# Patient Record
Sex: Female | Born: 1955
Health system: Southern US, Community
[De-identification: ages and names within clinical notes are randomized; demographics above are authoritative.]

## PROBLEM LIST (undated history)

## (undated) DIAGNOSIS — E039 Hypothyroidism, unspecified: Secondary | ICD-10-CM

## (undated) DIAGNOSIS — I10 Essential (primary) hypertension: Secondary | ICD-10-CM

## (undated) HISTORY — DX: Essential (primary) hypertension: I10

## (undated) HISTORY — PX: ABDOMINAL HYSTERECTOMY: SHX81

## (undated) HISTORY — DX: Hypothyroidism, unspecified: E03.9

---

## 1997-05-21 ENCOUNTER — Inpatient Hospital Stay (HOSPITAL_COMMUNITY): Admission: AD | Admit: 1997-05-21 | Discharge: 1997-05-21 | Payer: Self-pay | Admitting: Obstetrics & Gynecology

## 1997-05-28 ENCOUNTER — Inpatient Hospital Stay (HOSPITAL_COMMUNITY): Admission: AD | Admit: 1997-05-28 | Discharge: 1997-05-31 | Payer: Self-pay | Admitting: Obstetrics and Gynecology

## 1997-06-13 ENCOUNTER — Encounter: Admission: RE | Admit: 1997-06-13 | Discharge: 1997-09-11 | Payer: Self-pay | Admitting: Obstetrics and Gynecology

## 1997-10-26 ENCOUNTER — Other Ambulatory Visit: Admission: RE | Admit: 1997-10-26 | Discharge: 1997-10-26 | Payer: Self-pay | Admitting: Obstetrics and Gynecology

## 1998-01-02 ENCOUNTER — Ambulatory Visit (HOSPITAL_COMMUNITY): Admission: RE | Admit: 1998-01-02 | Discharge: 1998-01-02 | Payer: Self-pay | Admitting: Internal Medicine

## 1998-01-02 ENCOUNTER — Encounter: Payer: Self-pay | Admitting: Internal Medicine

## 1999-11-10 ENCOUNTER — Other Ambulatory Visit: Admission: RE | Admit: 1999-11-10 | Discharge: 1999-11-10 | Payer: Self-pay | Admitting: Obstetrics and Gynecology

## 1999-11-11 ENCOUNTER — Other Ambulatory Visit: Admission: RE | Admit: 1999-11-11 | Discharge: 1999-11-11 | Payer: Self-pay | Admitting: Obstetrics and Gynecology

## 1999-11-11 ENCOUNTER — Encounter (INDEPENDENT_AMBULATORY_CARE_PROVIDER_SITE_OTHER): Payer: Self-pay | Admitting: Specialist

## 1999-11-11 ENCOUNTER — Encounter (INDEPENDENT_AMBULATORY_CARE_PROVIDER_SITE_OTHER): Payer: Self-pay | Admitting: *Deleted

## 1999-12-01 ENCOUNTER — Ambulatory Visit (HOSPITAL_COMMUNITY): Admission: RE | Admit: 1999-12-01 | Discharge: 1999-12-01 | Payer: Self-pay | Admitting: Obstetrics and Gynecology

## 1999-12-01 ENCOUNTER — Encounter: Payer: Self-pay | Admitting: Obstetrics and Gynecology

## 2000-11-28 ENCOUNTER — Emergency Department (HOSPITAL_COMMUNITY): Admission: EM | Admit: 2000-11-28 | Discharge: 2000-11-28 | Payer: Self-pay | Admitting: Emergency Medicine

## 2001-08-30 ENCOUNTER — Encounter (INDEPENDENT_AMBULATORY_CARE_PROVIDER_SITE_OTHER): Payer: Self-pay | Admitting: Specialist

## 2001-08-30 ENCOUNTER — Inpatient Hospital Stay (HOSPITAL_COMMUNITY): Admission: RE | Admit: 2001-08-30 | Discharge: 2001-09-01 | Payer: Self-pay | Admitting: Obstetrics and Gynecology

## 2002-10-17 ENCOUNTER — Ambulatory Visit (HOSPITAL_COMMUNITY): Admission: RE | Admit: 2002-10-17 | Discharge: 2002-10-17 | Payer: Self-pay | Admitting: Obstetrics and Gynecology

## 2002-10-17 ENCOUNTER — Encounter: Payer: Self-pay | Admitting: Obstetrics and Gynecology

## 2002-10-20 ENCOUNTER — Emergency Department (HOSPITAL_COMMUNITY): Admission: EM | Admit: 2002-10-20 | Discharge: 2002-10-20 | Payer: Self-pay | Admitting: Emergency Medicine

## 2002-10-20 ENCOUNTER — Encounter: Payer: Self-pay | Admitting: Emergency Medicine

## 2003-04-29 ENCOUNTER — Emergency Department (HOSPITAL_COMMUNITY): Admission: EM | Admit: 2003-04-29 | Discharge: 2003-04-29 | Payer: Self-pay | Admitting: Emergency Medicine

## 2005-05-01 ENCOUNTER — Ambulatory Visit (HOSPITAL_COMMUNITY): Admission: RE | Admit: 2005-05-01 | Discharge: 2005-05-01 | Payer: Self-pay | Admitting: Family Medicine

## 2008-04-15 ENCOUNTER — Emergency Department (HOSPITAL_COMMUNITY): Admission: EM | Admit: 2008-04-15 | Discharge: 2008-04-15 | Payer: Self-pay | Admitting: Family Medicine

## 2008-04-16 ENCOUNTER — Emergency Department (HOSPITAL_COMMUNITY): Admission: EM | Admit: 2008-04-16 | Discharge: 2008-04-16 | Payer: Self-pay | Admitting: Emergency Medicine

## 2008-06-04 ENCOUNTER — Ambulatory Visit (HOSPITAL_COMMUNITY): Admission: RE | Admit: 2008-06-04 | Discharge: 2008-06-04 | Payer: Self-pay | Admitting: Neurology

## 2010-02-16 ENCOUNTER — Encounter: Payer: Self-pay | Admitting: Family Medicine

## 2010-03-10 ENCOUNTER — Other Ambulatory Visit: Payer: Self-pay | Admitting: Dermatology
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
## 2010-05-06 LAB — OLIGOCLONAL BANDS, CSF + SERM
CSF Oligoclonal Bands: POSITIVE
IgG Index, CSF: 0.77 ratio — ABNORMAL HIGH (ref 0.28–0.66)
IgG/Albumin Ratio, CSF: 0.25 ratio (ref 0.09–0.25)
MS CNS IgG Synthesis Rate: 5.5 mg/d (ref <=8.0)

## 2010-05-06 LAB — PROTEIN AND GLUCOSE, CSF
Glucose, CSF: 60 mg/dL (ref 43–76)
Total  Protein, CSF: 37 mg/dL (ref 15–45)

## 2010-05-06 LAB — CSF CELL COUNT WITH DIFFERENTIAL: Tube #: 2

## 2010-05-06 LAB — VDRL, CSF: VDRL Quant, CSF: NONREACTIVE

## 2010-05-06 LAB — B. BURGDORFI ANTIBODIES, CSF: Lyme Ab: 0.18 LIV

## 2010-05-08 LAB — URINALYSIS, ROUTINE W REFLEX MICROSCOPIC
Bilirubin Urine: NEGATIVE
Ketones, ur: NEGATIVE mg/dL
Nitrite: NEGATIVE
Protein, ur: NEGATIVE mg/dL
Urobilinogen, UA: 1 mg/dL (ref 0.0–1.0)

## 2010-05-08 LAB — URINE MICROSCOPIC-ADD ON

## 2010-06-13 NOTE — Discharge Summary (Signed)
NAME:  Jillian Wright, Jillian Wright                     ACCOUNT NO.:  [id number]   MEDICAL RECORD NO.:  [id number]                   PATIENT TYPE:  INP   LOCATION:  9326                                 FACILITY:  [facility]   PHYSICIAN:  Hal Morales, M.D.             DATE OF BIRTH:  [birth year]   DATE OF ADMISSION:  08/30/2001  DATE OF DISCHARGE:  09/01/2001                                 DISCHARGE SUMMARY   DISCHARGE DIAGNOSES:  1. Menorrhagia.  2. Fibroid uterus.  3. Pelvic adhesions.   OPERATION:  On the day of admission the patient underwent a total abdominal  hysterectomy with a bilateral salpingectomy with lysis of adhesions,  tolerating all procedures well.   HISTORY OF PRESENT ILLNESS:  The patient is a 55 year old married African-  American female, para 3-0-0-3 who is status post bilateral tubal ligation  with a long-standing history of uterine fibroids and menorrhagia. The  patient represents for definitive treatment of her symptoms through  hysterectomy.  Please see patient's dictated history and physical  examination for details.   PHYSICAL EXAMINATION:  VITAL SIGNS:  Blood pressure 100/60. Weight is 190.  Height is 5 feet 7-1/2 inches tall.  GENERAL EXAM:  Within normal limits, except note on the abdominal exam bowel  sounds are present.  It is soft nontender and there is a mass from the  pelvis to the level of three fingerbreadths below the umbilicus.  PELVIC:  External genitalia/Bartholin, urethral, and Skene glands within  normal limits. The vagina is ruga.  Cervix is nontender without lesions.  Uterus is 18 to 16 weeks size and irregular without tenderness.  Adnexa  without tenderness or masses.  Rectovaginal exam confirms.   HOSPITAL COURSE:  On the day of admission the patient underwent  aforementioned procedures tolerating them all well.  Postoperative course  was unremarkable with the exception of the patient experiencing some  agitation following anesthesia.   Postoperative hemoglobin 9.1 (preoperative  hemoglobin 11.1).  By postoperative day #2 the patient had resumed bowel and  bladder function and was admitted for discharge home.   DISCHARGE MEDICATIONS:  1. Darvocet-N 100 one tablet every four hours as needed for pain.  2. Ibuprofen 600 mg one tablet every six hours with food for five days as     needed for pain.  3. Phenergan one tablet every six hours as needed for nausea.  4. Stool softeners 100 mg one tablet twice daily until bowel movements are     regular.  5. Iron 325 mg one tablet twice daily for six weeks.   FOLLOW UP:  The patient is scheduled for a six weeks' postoperative exam  with Dr. Pennie Rushing on October 10, 2001 at 9:45 a.m.   DISCHARGE INSTRUCTIONS:  The patient was given a copy of [facility] postoperative instruction sheet.  She was further  advised of no driving for two weeks, heavy lifting for four weeks  and  intercourse for six weeks.  The patient's diet was without restrictions.   FINAL PATHOLOGY:  UTERUS, CERVIX, BILATERAL FALLOPIAN TUBES: Benign cervix.  No dysplasia identified.  ENDOMETRIUM:  Endometrial polyps with simple hyperplasia without atypia.  Proliferative endometrium.  No hyperplasia in nonpolypoid endometrium.  Leiomyomata with degenerative changes intramural.  Uterine serosal fibrous  adhesions.  No endometriosis or malignancy identified.  Benign right and  left fallopian tubes.     Elmira J. Adline Peals.                    Hal Morales, M.D.    EJP/MEDQ  D:  09/29/2001  T:  09/29/2001  Job:  [id number]

## 2010-06-13 NOTE — H&P (Signed)
[facility]  Patient:    Jillian Wright, Jillian Wright Visit Number: [id number] MRN: [id number]          Service Type: Attending:  Maris Berger. Pennie Rushing, M.D. Dictated by:   Henreitta Leber, P.A. Adm. Date:  08/08/01                           History and Physical  DATE OF BIRTH:                [birth year]  HISTORY OF PRESENT ILLNESS:  The patient is a 55 year old married African-American female, para 3-0-0-3 who is status post bilateral tubal ligation with a long standing history of uterine fibroids and menorrhagia. The patients most recent menstrual period lasted 24 days causing her to have to change an overnight pad six times daily and to wear at night "pull-ups". The patient also states she had moderate cramping along with rectal pain; however, they both responded well to Motrin 400 mg as prescribed. An endometrial biopsy of July 26, 2001 was insufficient for diagnosis but a previous study in October of 2001 for the same symptoms revealed benign proliferative endometrium. The patient had a TSH in July of 2003 which was within normal limits and her most recent hemoglobin (July 26, 2001) was 11.6. After reviewing in detail the medical and surgical options available for management of menorrhagia, the patient has decided in favor of hysterectomy. The [employer] [facility] brochure on hysterectomy and preparing for surgery were also given to the patient.  PAST MEDICAL HISTORY:  OBSTETRIC HISTORY:             Gravida 3, para 3-0-0-3; the patient experienced cesarean section x3.  GYNECOLOGIC HISTORY:          Menarche 55 years old. Menstrual periods are as history of present illness. Contraception bilateral tubal ligation. The patient has a history of uterine fibroids. She has a remote history of gonorrhea. She denies any history of abnormal Pap smears with her most recent Pap August 03, 2001 still pending. Mammogram (November 2001) was within  normal limits.  MEDICAL HISTORY:              Positive for anxiety.  SURGICAL HISTORY:             Cesarean section x3; and rhinoplasty. The patient states that she had a severe "burning" sensation of her skin when she received an epidural during a previous surgical procedure. She has no history of blood transfusions.  FAMILY HISTORY:               Positive for non-insulin-dependent diabetes.  SOCIAL HISTORY:               The patient is married and she functions as a Futures trader.  CURRENT MEDICATIONS:          Zoloft 25 mg as needed.  ALLERGIES:                    The patient has no known drug allergies except for an epidural as previously described.  HABITS:                       The patient does not smoke or consume alcohol.  REVIEW OF SYSTEMS:            Negative except as mentioned in history of present illness. The patient also wears contact lenses.  PHYSICAL EXAMINATION:  VITAL SIGNS:                  Blood pressure 100/60, weight is 190, height is 5 feet 7 1/2 inches tall.  EAR/NOSE/THROAT:              Within normal limits.  NECK:                         Supple without masses. There is no thyromegaly.  HEART:                        Regular rate and rhythm. There is no murmur.  LUNGS:                        Clear to auscultation. There are no wheezes, rales or rhonchi.  BACK:                         No CVA tenderness.  ABDOMEN:                      Bowel sounds are present, it is soft, nontender. The patient does, however, have a firm mass arising from the pelvis to the level of three finger breadths below the umbilicus.  EXTREMITIES:                  Without cyanosis, clubbing or edema.  PELVIC:                       EGBUS is within normal limits. Vagina is rugose. Cervix is nontender without lesions. Uterus is 18-20 week size and irregular without tenderness. Adnexa without tenderness or masses. Rectovaginal exam confirms.  IMPRESSION:                   1.  Menorrhagia.                               2. Fibroid uterus.  DISPOSITION:                  A discussion was held with the patient regarding options for management and treatment of her presenting symptoms both medical and surgical and she has consented to undergo hysterectomy. The implications for her procedure along with the risks associated with it to include but not limited to reactions to anesthesia, damage to adjacent organs, infection, and bleeding were reviewed. The patient is scheduled to undergo a total abdominal hysterectomy on September 09, 2001 at 7:30 a.m. [facility] in [place]. Dictated by:   Henreitta Leber, P.A. Attending:  Maris Berger. Pennie Rushing, M.D. DD:  08/08/01 TD:  08/08/01 Job: [id number]

## 2010-06-13 NOTE — Op Note (Signed)
NAME:  Jillian Wright, Jillian Wright                     ACCOUNT NO.:  [id number]   MEDICAL RECORD NO.:  [id number]                   PATIENT TYPE:  INP   LOCATION:  9326                                 FACILITY:  [facility]   PHYSICIAN:  Hal Morales, M.D.             DATE OF BIRTH:  1956-01-21   DATE OF PROCEDURE:  08/30/2001  DATE OF DISCHARGE:                                 OPERATIVE REPORT   PREOPERATIVE DIAGNOSES:  Menorrhagia, uterine fibroids.   POSTOPERATIVE DIAGNOSES:  Menorrhagia, uterine fibroids, pelvic adhesions,  question endometriosis.   OPERATION:  Total abdominal hysterectomy, bilateral salpingectomy, lysis of  adhesions, and revision of abdominal incision.   ANESTHESIA:  General orotracheal.   ESTIMATED BLOOD LOSS:  300 cc.   COMPLICATIONS:  None.   FINDINGS:  The uterus was enlarged to approximately 14 weeks size and  weighed 700 g.  The tubes were adherent bilaterally and were status post  interruption for tubal sterilization.  The ovaries appeared normal.  There  were implants in the posterior cul-de-sac consistent with endometriosis.   SURGEON:  Hal Morales, M.D.   FIRST ASSISTANT:  Elmira J. Adline Peals.   PROCEDURE:  The patient was taken to the operating room after appropriate  identification and placed on the operating table.  After the attainment of  general anesthesia with the patient in the supine position, the abdomen,  perineum, and vagina were prepped with multiple layers of Betadine and a  Foley catheter inserted into the bladder under sterile conditions and  connected to straight drainage.  The abdomen was draped as a sterile field.  The transverse incision was then excised and removed from the operative  field.  The abdomen was then opened in layers and the peritoneum entered.  Self retaining O'Connor-O-Sullivan retractor was placed in the abdominal  incision and the bowel packed cephalad.  A Kelly clamp was placed on the  right uterine  fundus and the right round ligament suture ligated and  incised.  That incision was taken anteriorly on the anterior leaf of the  broad ligament.  The utero-ovarian ligament was then clamped, cut, and  suture ligated.  The distal fallopian tube was then removed from the ovarian  pedicle and hemostasis achieved with a suture ligature.  The left round  ligament and uterine fundus were then grasped with a Kelly clamp and  elevated.  The left round ligament was suture ligated and incised and that  incision taken anteriorly on the anterior leaf of the broad ligament.  The  utero-ovarian ligament was isolated, clamped, cut, and suture ligated, then  tied with a free tie.  The left fallopian tube was likewise excised from the  left ovary and removed from the operative field.  Hemostasis was noted to be  adequate.  The bladder was dissected off the anterior cervix with a  combination of blunt and sharp dissection and the uterus elevated into the  operative field.  The uterine artery on the left side was skeletonized,  clamped, cut, and suture ligated.  A similar procedure was carried out with  the uterine artery in the opposite side.  The uterus was then excised from  the cervix and removed from the operative field.  The paracervical tissues,  uterosacral ligaments, and vaginal angles were then successively clamped,  cut, and suture ligated on the right and left sides after further dissecting  the bladder off the anterior cervix.  The sutures which held the vaginal  angles and the uterosacral ligaments were held and the remainder of the  cervix was excised from the upper vagina.  The vaginal cuff was closed with  figure-of-eight sutures and hemostasis noted to be adequate.  All sutures to  this point were 0 Vicryl.  Copious irrigation was carried out and a small  area of bleeding in the right pelvic side wall was noted, suture ligated,  and then noted to be hemostatic.  The sutures from the vaginal  angles and  uterosacral ligaments on either side were tied together and the two  uterosacral ligaments tied together in the midline.  It was noted that there  were powder burn like lesions between the uterosacral ligaments posteriorly  that were consistent with endometriosis and these were fulgurated.  Copious  irrigation was carried out.  Hemostasis noted to be adequate and all  instruments removed from the peritoneal cavity.  The abdominoperitoneum was  then closed with a running suture of 2-0 Vicryl.  The rectus muscles were  irrigated and made hemostatic with Bovie cautery.  The rectus fascia was  closed with a running suture of 0 Vicryl from each apex to the midline and  tied in the midline.  The subcutaneous tissue was irrigated and made  hemostatic with Bovie cautery.  Then, three subcutaneous mattress sutures  placed with 2-0 plain.  The skin incision was then closed in a subcuticular  fashion with 3-0 Monocryl and a sterile dressing applied.  The patient was  then awakened from general anesthesia and taken to the recovery room in  satisfactory condition having tolerated the procedure well with sponge and  instrument counts correct.   SPECIMENS:  Uterine fundus, uterine cervix, and bilateral fallopian tubes.                                               Hal Morales, M.D.    VPH/MEDQ  D:  08/30/2001  T:  09/02/2001  Job:  [id number]

## 2010-08-19 ENCOUNTER — Other Ambulatory Visit (HOSPITAL_COMMUNITY): Payer: Self-pay | Admitting: Family Medicine

## 2010-08-19 DIAGNOSIS — Z1231 Encounter for screening mammogram for malignant neoplasm of breast: Secondary | ICD-10-CM

## 2010-08-28 ENCOUNTER — Ambulatory Visit (HOSPITAL_COMMUNITY): Payer: BC Managed Care – PPO | Attending: Family Medicine

## 2011-01-29 ENCOUNTER — Other Ambulatory Visit (HOSPITAL_COMMUNITY): Payer: Self-pay | Admitting: Family Medicine

## 2011-01-29 DIAGNOSIS — Z1231 Encounter for screening mammogram for malignant neoplasm of breast: Secondary | ICD-10-CM

## 2011-01-30 ENCOUNTER — Ambulatory Visit (HOSPITAL_COMMUNITY)
Admission: RE | Admit: 2011-01-30 | Discharge: 2011-01-30 | Disposition: A | Discharge status: Home / Self Care | Payer: BC Managed Care – PPO | Source: Ambulatory Visit | Attending: Family Medicine | Admitting: Family Medicine

## 2011-01-30 DIAGNOSIS — Z1231 Encounter for screening mammogram for malignant neoplasm of breast: Secondary | ICD-10-CM | POA: Insufficient documentation

## 2011-03-19 ENCOUNTER — Other Ambulatory Visit: Payer: Self-pay | Admitting: Dermatology

## 2011-12-19 ENCOUNTER — Emergency Department (HOSPITAL_BASED_OUTPATIENT_CLINIC_OR_DEPARTMENT_OTHER)
Admission: EM | Admit: 2011-12-19 | Discharge: 2011-12-19 | Disposition: A | Discharge status: Home / Self Care | Payer: BC Managed Care – PPO | Attending: Emergency Medicine | Admitting: Emergency Medicine

## 2011-12-19 ENCOUNTER — Encounter (HOSPITAL_BASED_OUTPATIENT_CLINIC_OR_DEPARTMENT_OTHER): Payer: Self-pay | Admitting: *Deleted

## 2011-12-19 ENCOUNTER — Emergency Department (HOSPITAL_BASED_OUTPATIENT_CLINIC_OR_DEPARTMENT_OTHER): Payer: BC Managed Care – PPO

## 2011-12-19 DIAGNOSIS — M25569 Pain in unspecified knee: Secondary | ICD-10-CM | POA: Insufficient documentation

## 2011-12-19 DIAGNOSIS — T148XXA Other injury of unspecified body region, initial encounter: Secondary | ICD-10-CM

## 2011-12-19 DIAGNOSIS — IMO0002 Reserved for concepts with insufficient information to code with codable children: Secondary | ICD-10-CM | POA: Insufficient documentation

## 2011-12-19 DIAGNOSIS — Y9389 Activity, other specified: Secondary | ICD-10-CM | POA: Insufficient documentation

## 2011-12-19 DIAGNOSIS — Y929 Unspecified place or not applicable: Secondary | ICD-10-CM | POA: Insufficient documentation

## 2011-12-19 DIAGNOSIS — R05 Cough: Secondary | ICD-10-CM | POA: Insufficient documentation

## 2011-12-19 DIAGNOSIS — J3489 Other specified disorders of nose and nasal sinuses: Secondary | ICD-10-CM | POA: Insufficient documentation

## 2011-12-19 DIAGNOSIS — R059 Cough, unspecified: Secondary | ICD-10-CM | POA: Insufficient documentation

## 2011-12-19 DIAGNOSIS — X500XXA Overexertion from strenuous movement or load, initial encounter: Secondary | ICD-10-CM | POA: Insufficient documentation

## 2011-12-19 DIAGNOSIS — Z79899 Other long term (current) drug therapy: Secondary | ICD-10-CM | POA: Insufficient documentation

## 2011-12-19 NOTE — ED Provider Notes (Signed)
Medical screening examination/treatment/procedure(s) were performed by non-physician practitioner and as supervising physician I was immediately available for consultation/collaboration.    Nelia Shi, MD 12/19/11 1345

## 2011-12-19 NOTE — ED Notes (Signed)
Patient with chest pain that started about three days ago.  Pain was located on her left chest that is non-radiating.  Patient does not have any associated chest pain symptoms.  Patient also that last night started having left pain behind the knee.  Patient sent here for evaluation to make sure she does not have DVT

## 2011-12-19 NOTE — ED Provider Notes (Signed)
History     CSN: [id number]  Arrival date & time 12/19/11  1143   First MD Initiated Contact with Patient 12/19/11 1201      Chief Complaint  Patient presents with  . Chest Pain    (Consider location/radiation/quality/duration/timing/severity/associated sxs/prior treatment) HPI History provided by pt.   Pt has had pain posterior L knee for the past 10 days.  Pain started after her daughter stretched her legs prior to exercising.  Acutely worsened yesterday.  No aggravating or alleviating factors.  No associated edema.  No pain today.  No RF for DVT.  Also c/o 2 brief episodes of sharp, non-radiating left lower CP while at work 2 days ago.  She was not exerting herself at the time.  No associated sx.  She has had a cough since yesterday but no fever or SOB.  She has also had nasal congestion and rhinorrhea x 4-5 days.  Known sick contacts.  No personal or FH cardiac disease. Does not smoke cigarettes.  History reviewed. No pertinent past medical history.  History reviewed. No pertinent past surgical history.  No family history on file.  History  Substance Use Topics  . Smoking status: Never Smoker   . Smokeless tobacco: Not on file  . Alcohol Use: No    OB History    Grav Para Term Preterm Abortions TAB SAB Ect Mult Living                  Review of Systems  All other systems reviewed and are negative.    Allergies  Bee pollen  Home Medications   Current Outpatient Rx  Name  Route  Sig  Dispense  Refill  . ALPRAZOLAM 0.25 MG PO TABS   Oral   Take 0.25 mg by mouth at bedtime as needed.         Marland Kitchen LEVOTHYROXINE SODIUM 50 MCG PO TABS   Oral   Take 50 mcg by mouth daily.           BP 144/83  Pulse 76  Temp 98.2 F (36.8 C) (Oral)  Resp 16  SpO2 100%  Physical Exam  Nursing note and vitals reviewed. Constitutional: She is oriented to person, place, and time. She appears well-developed and well-nourished. No distress.  HENT:  Head: Normocephalic and  atraumatic.  Eyes:       Normal appearance  Neck: Normal range of motion.  Cardiovascular: Normal rate, regular rhythm and intact distal pulses.   Pulmonary/Chest: Effort normal and breath sounds normal. No respiratory distress. She exhibits no tenderness.       Mild tenderness left lower anterior chest.  No pleuritic pain reported  Abdominal: Soft. Bowel sounds are normal. She exhibits no distension. There is no tenderness. There is no guarding.  Musculoskeletal: Normal range of motion.       No peripheral edema.  No LLE tenderness.  No palpable baker's cyst.   Neurological: She is alert and oriented to person, place, and time.  Skin: Skin is warm and dry. No rash noted.  Psychiatric: She has a normal mood and affect. Her behavior is normal.    ED Course  Procedures (including critical care time)   Date: 12/19/2011  Rate: 76   Rhythm: normal sinus rhythm and premature atrial contractions (PAC)  QRS Axis: normal  Intervals: normal  ST/T Wave abnormalities: normal  Conduction Disutrbances:nonspecific intraventricular conduction delay  Narrative Interpretation:   Old EKG Reviewed: none available   Labs Reviewed - No data  to display US Venous Img Lower Unilateral Left  12/19/2011  *RADIOLOGY REPORT*  Clinical Data: left leg pain  LEFT LOWER EXTREMITY VENOUS DUPLEX ULTRASOUND  Technique:  Gray-scale sonography with graded compression, as well as color Doppler and duplex ultrasound were performed to evaluate the deep venous system of the lower extremity from the level of the common femoral vein through the popliteal and proximal calf veins. Spectral Doppler was utilized to evaluate flow at rest and with distal augmentation maneuvers.  Comparison:  None.  Findings:  Normal compressibility of the common femoral, superficial femoral, and popliteal veins is demonstrated, as well as the visualized proximal calf veins.  No filling defects to suggest DVT on grayscale or color Doppler imaging.   Doppler waveforms show normal direction of venous flow, normal respiratory phasicity and response to augmentation.  IMPRESSION: No evidence of lower extremity deep vein thrombosis.   Original Report Authenticated By: Judie Petit. Shick, M.D.      1. Muscle strain       MDM  56yo healthy F referred from [facility] for venous doppler of LLE to r/o DVT.  Pt has no RF for nor signs of DVT on exam and known instigating factor 1.5wks ago, but she is highly concerned.  Doppler ordered and pending.  She also reports 2 episodes of sharp, non-exertional, non-radiating, L anterior CP w/out associated sx 2-3 days ago.   Low risk ACS, pain atypical and EKG non-ischemic.  Doubt pneumonia; no reported fever, short duration of cough w/ presence of other URI sx, no tachypnea/tachycardia.  PE also highly unlikely based on VS and lack of RF.  Pain reproducible w/ palpation on exam.  Likely musculoskeletal.  Pt has been reassured.    Doppler neg for DVT.  Results discussed w/ pt.  Will treat symptomatically w/ NSAID, ice, rest.  Return precautions discussed.         Jillian Wright [place], [place] 12/19/11 1342

## 2012-12-31 ENCOUNTER — Encounter (HOSPITAL_COMMUNITY): Payer: Self-pay | Admitting: Emergency Medicine

## 2012-12-31 ENCOUNTER — Emergency Department (HOSPITAL_COMMUNITY)
Admission: EM | Admit: 2012-12-31 | Discharge: 2012-12-31 | Disposition: A | Discharge status: Home / Self Care | Payer: BC Managed Care – PPO | Attending: Emergency Medicine | Admitting: Emergency Medicine

## 2012-12-31 DIAGNOSIS — T2692XA Corrosion of left eye and adnexa, part unspecified, initial encounter: Secondary | ICD-10-CM

## 2012-12-31 DIAGNOSIS — Z79899 Other long term (current) drug therapy: Secondary | ICD-10-CM | POA: Insufficient documentation

## 2012-12-31 DIAGNOSIS — Y929 Unspecified place or not applicable: Secondary | ICD-10-CM | POA: Insufficient documentation

## 2012-12-31 DIAGNOSIS — Y939 Activity, unspecified: Secondary | ICD-10-CM | POA: Insufficient documentation

## 2012-12-31 DIAGNOSIS — T1590XA Foreign body on external eye, part unspecified, unspecified eye, initial encounter: Secondary | ICD-10-CM | POA: Insufficient documentation

## 2012-12-31 MED ORDER — FLUORESCEIN SODIUM 1 MG OP STRP
1.0000 | ORAL_STRIP | Freq: Once | OPHTHALMIC | Status: AC
  Administered 2012-12-31: 1 via OPHTHALMIC
  Filled 2012-12-31: qty 1

## 2012-12-31 MED ORDER — TETRACAINE HCL 0.5 % OP SOLN
1.0000 [drp] | Freq: Once | OPHTHALMIC | Status: AC
  Administered 2012-12-31: 1 [drp] via OPHTHALMIC

## 2012-12-31 MED ORDER — ERYTHROMYCIN 5 MG/GM OP OINT
TOPICAL_OINTMENT | Freq: Once | OPHTHALMIC | Status: AC
  Administered 2012-12-31: 1 via OPHTHALMIC
  Filled 2012-12-31: qty 1

## 2012-12-31 MED ORDER — TETRACAINE HCL 0.5 % OP SOLN
1.0000 [drp] | Freq: Once | OPHTHALMIC | Status: AC
  Administered 2012-12-31: 1 [drp] via OPHTHALMIC
  Filled 2012-12-31: qty 2

## 2012-12-31 NOTE — ED Notes (Signed)
Dr. Wickline at the bedside.  

## 2012-12-31 NOTE — ED Notes (Signed)
Pt states she got some glue in her eyes but does not know how it got there.  Pt states she think she may have had some on here hands and it got in her eyes while she was sleeping.

## 2012-12-31 NOTE — ED Provider Notes (Signed)
CSN: [id number]     Arrival date & time 12/31/12  0221 History   First MD Initiated Contact with Patient 12/31/12 0243     Chief Complaint  Patient presents with  . Eye Problem    Patient is a 57 y.o. female presenting with eye problem. The history is provided by the patient.  Eye Problem Location:  L eye Severity:  Mild Onset quality:  Sudden Duration: unknown. Timing:  Constant Progression:  Unchanged Chronicity:  New Context: chemical exposure   Relieved by:  Nothing Worsened by:  Nothing tried Associated symptoms: redness   pt reports she was using gorilla glue last night while in her home She does not recall getting the glue in her eyes She went to sleep and felt like there may be glue in her left eye as it feels as if there is something in the eye No direct trauma to eye No drainage She reports redness to the eye No visual loss She does not wear contact lenses  PMH - none Past Surgical History  Procedure Laterality Date  . Abdominal hysterectomy     No family history on file. History  Substance Use Topics  . Smoking status: Never Smoker   . Smokeless tobacco: Not on file  . Alcohol Use: No   OB History   Grav Para Term Preterm Abortions TAB SAB Ect Mult Living                 Review of Systems  Constitutional: Negative for fever.  Eyes: Positive for redness.    Allergies  Bee pollen  Home Medications   Current Outpatient Rx  Name  Route  Sig  Dispense  Refill  . levothyroxine (SYNTHROID, LEVOTHROID) 50 MCG tablet   Oral   Take 50 mcg by mouth daily.          BP 150/88  Pulse 75  Temp(Src) 97.7 F (36.5 C) (Oral)  Resp 18  SpO2 100% Physical Exam CONSTITUTIONAL: Well developed/well nourished HEAD: Normocephalic/atraumatic EYES: EOMI/PERRL. Mild conjunctival erythema to OS.  There is no evidence of foreign body to left eye.   No abrasions noted in either eye.  No corneal hazing noted in either eye. No evidence of glue in either eye ENMT:  Mucous membranes moist NECK: supple no meningeal signs LUNGS:  no apparent distress ABDOMEN: soft NEURO: Pt is awake/alert, moves all extremitiesx4 EXTREMITIES: full ROM SKIN: warm, color normal PSYCH: no abnormalities of mood noted  ED Course  Procedures (including critical care time) Labs Review Labs Reviewed - No data to display Imaging Review No results found.  EKG Interpretation   None       MDM  No diagnosis found. Nursing notes including past medical history and social history reviewed and considered in documentation   Will give erythromycin eye ointment Given out patient referral to ophthalmology   Joya Gaskins, MD 12/31/12 [phone]

## 2012-12-31 NOTE — ED Notes (Signed)
Patient is not sure if she might have gotten glue in her other eye and wants to check to make sure

## 2013-04-04 ENCOUNTER — Encounter: Payer: Self-pay | Admitting: Neurology

## 2013-04-04 ENCOUNTER — Other Ambulatory Visit: Payer: Self-pay | Admitting: Neurology

## 2013-04-04 ENCOUNTER — Ambulatory Visit (INDEPENDENT_AMBULATORY_CARE_PROVIDER_SITE_OTHER): Payer: BC Managed Care – PPO | Admitting: Neurology

## 2013-04-04 VITALS — BP 128/70 | HR 74 | Temp 97.4°F | Ht 67.0 in | Wt 196.0 lb

## 2013-04-04 DIAGNOSIS — R292 Abnormal reflex: Secondary | ICD-10-CM

## 2013-04-04 DIAGNOSIS — M25569 Pain in unspecified knee: Secondary | ICD-10-CM

## 2013-04-04 DIAGNOSIS — R413 Other amnesia: Secondary | ICD-10-CM | POA: Insufficient documentation

## 2013-04-04 LAB — RHEUMATOID FACTOR: Rhuematoid fact SerPl-aCnc: 27 IU/mL — ABNORMAL HIGH (ref <=14)

## 2013-04-04 LAB — SEDIMENTATION RATE: Sed Rate: 7 mm/hr (ref 0–22)

## 2013-04-04 LAB — CREATININE, SERUM: CREATININE: 0.83 mg/dL (ref 0.50–1.10)

## 2013-04-04 LAB — BUN: BUN: 12 mg/dL (ref 6–23)

## 2013-04-04 LAB — ANGIOTENSIN CONVERTING ENZYME: ANGIOTENSIN-CONVERTING ENZYME: 33 U/L (ref 8–52)

## 2013-04-04 NOTE — Progress Notes (Signed)
NEUROLOGY CONSULTATION NOTE  Jillian Wright MRN: [id number] DOB: Apr 01, 1955  Referring provider: Dr. Carol Ada Primary care provider: Dr. Carol Ada  Reason for consult:  Memory issues  Dear Dr Tamala Julian:  Thank you for your kind referral of Jillian Wright for consultation of the above symptoms. Although her history is well known to you, please allow me to reiterate it for the purpose of our medical record.   HISTORY OF PRESENT ILLNESS: This is a very pleasant 58 year old right-handed woman with a history of hypothyroidism and prior abnormal brain MRI in 2010, presenting for worsening memory problems.  She started noticing these symptoms 5 years ago, however this has worsened recently where she cannot decipher whether an event happened this morning or yesterday.  She reports "the days blend in."  She can recall the month or date, but not the year.  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  She can get confused if she is assigned to a different department.  She occasionally forgets to take her Synthroid for several days or weeks.  She has left the stove on in the past and burned food.  She drives without getting lost.  Her husband is in charge of their bills.  She has occasional word-finding difficulties.  Over the past few weeks, she has been waking up with a mild dull nagging left-sided headache with no associated nausea, vomiting, photo or phonophobia.  She had some blurred vision and is currently seeing her ophthalmologist for a new lens prescription.  She denies any gaps in time or staring/unresponsive episodes.  She has been having pain in both legs for the past week after exercising with her daughter.  There is pain on the lateral right thigh and on her left knee.  No associated numbness/tingling/weakness.  She had to take a dose of clonazepam last night to help her sleep due to the pain.  She usually takes  this prn for restless leg symptoms when sitting in a car for a prolonged period.  She endorses more stress recently with being the caregiver for her father.  She took Lexapro for 10-15 years in the past but stopped this due to side effects on escitalopram.  She reports that she "tends to put herself on the back burner."  She denies any dizziness, diplopia, dysarthria, dysphagia, neck/back pain, bowel or bladder dysfunction.   Records were personally reviewed where available. She brings an MRI brain report from 2010, images unavailable for review.  MRI was abnormal with bilateral periatrial, periventricular, and subcortical, as well as right pontine white matter hyperintensities suspicious for demyelinating disease.  A single faintly enhancing right pontomedullary junction lesion is also noted.  There is diffuse fatty marrow infilbration of the clivus and upper cervical spine which is a finding of unclear significance.  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  Laboratory Data: Non available for review.  PAST MEDICAL HISTORY: Past Medical History  Diagnosis Date  . Hypothyroidism     PAST SURGICAL HISTORY: Past Surgical History  Procedure Laterality Date  . Abdominal hysterectomy      MEDICATIONS: Current Outpatient Prescriptions on File Prior to Visit  Medication Sig Dispense Refill  . levothyroxine (SYNTHROID, LEVOTHROID) 50 MCG tablet Take 50 mcg by mouth daily.       No current facility-administered medications on file prior to visit.    ALLERGIES: Allergies  Allergen Reactions  . Bee  Pollen Anaphylaxis    FAMILY HISTORY: Family History  Problem Relation Age of Onset  . Ataxia Neg Hx   . Chorea Neg Hx   . Dementia Neg Hx   . Mental retardation Neg Hx   . Migraines Neg Hx   . Multiple sclerosis Neg Hx   . Neurofibromatosis Neg Hx   . Neuropathy Neg Hx   . Parkinsonism Neg Hx   . Seizures Neg Hx   . Stroke Neg Hx     SOCIAL  HISTORY: History   Social History  . Marital Status: Married    Spouse Name: N/A    Number of Children: N/A  . Years of Education: N/A   Occupational History  . Not on file.   Social History Main Topics  . Smoking status: Never Smoker   . Smokeless tobacco: Not on file  . Alcohol Use: No  . Drug Use: Not on file  . Sexual Activity: Not on file   Other Topics Concern  . Not on file   Social History Narrative  . No narrative on file    REVIEW OF SYSTEMS: Constitutional: No fevers, chills, or sweats, no generalized fatigue, change in appetite Eyes: No visual changes, double vision, eye pain Ear, nose and throat: No hearing loss, ear pain, nasal congestion, sore throat Cardiovascular: No chest pain, palpitations Respiratory:  No shortness of breath at rest or with exertion, wheezes GastrointestinaI: No nausea, vomiting, diarrhea, abdominal pain, fecal incontinence Genitourinary:  No dysuria, urinary retention or frequency Musculoskeletal:  No neck pain, back pain. + left knee and right thigh pain Integumentary: No rash, pruritus, skin lesions Neurological: as above Psychiatric: No depression, insomnia, anxiety Endocrine: No palpitations, fatigue, diaphoresis, mood swings, change in appetite, change in weight, increased thirst Hematologic/Lymphatic:  No anemia, purpura, petechiae. Allergic/Immunologic: no itchy/runny eyes, nasal congestion, recent allergic reactions, rashes  PHYSICAL EXAM: Filed Vitals:   04/04/13 1312  BP: 128/70  Pulse: 74  Temp: 97.4 F (36.3 C)   General: No acute distress Head:  Normocephalic/atraumatic Neck: supple, no paraspinal tenderness, full range of motion Back: No paraspinal tenderness Heart: regular rate and rhythm Lungs: Clear to auscultation bilaterally. Vascular: No carotid bruits. Skin/Extremities: No rash, no edema Neurological Exam: Mental status: alert and oriented to person, place, and time, no dysarthria or dysphagia, Fund  of knowledge is appropriate.  Remote memory are intact.  Attention and concentration are normal.    Able to name objects and repeat phrases. MOCA score is normal at 27/30 (nl >26/30), needed category clue for 2 objects for 5-minute recall and cube drawing. Cranial nerves: CN I: not tested CN II: pupils equal, round and reactive to light, visual fields intact, fundi unremarkable. CN III, IV, VI:  full range of motion, no nystagmus, no ptosis CN V: facial sensation intact CN VII: upper and lower face symmetric CN VIII: hearing intact CN IX, X: gag intact, uvula midline CN XI: sternocleidomastoid and trapezius muscles intact CN XII: tongue midline Bulk & Tone: normal, no fasciculations. Motor: 5/5 throughout with no pronator drift. Sensation: intact to light touch, cold, pin, vibration and joint position sense.  No extinction to double simultaneous stimulation.  Romberg test negative Deep Tendon Reflexes: brisk +3 throughout, no ankle clonus, negative Hoffman's sign Plantar responses: downgoing bilaterally Finger to nose testing: no incoordination Gait: narrow-based and steady, able to tandem walk adequately.  IMPRESSION: This is a 58 year old right-handed woman with a history of hypothyroidism presenting with worsening memory since 2010 that  has progressed over the past few months.  She had a brain MRI in 2010 that was abnormal, concerning for demyelinating disease, but it appears she was lost to follow-up at that time.  Her exam shows diffuse hyperreflexia, MOCA score is normal with missed points for recent memory.  Bloodwork for TSH, B12, ESR, angiotensin converting enzyme (ACE) level, Lyme Ab, ANA, RF will be sent.  MRI brain with and without contrast will be ordered to assess for interval change, prior MRI had shown enhancing lesion in the pontomedullary junction and bilateral FLAIR abnormalities.  She will follow-up after the tests.  She is concerned about her leg pain, no neuropathic changes  noted, most likely musculoskeletal, continue anti-inflammatory as prescribed by her PCP.  Thank you for allowing me to participate in the care of this patient. Please do not hesitate to call for any questions or concerns.   Jillian Wright, M.D.

## 2013-04-04 NOTE — Patient Instructions (Addendum)
1. MRI brain with and without contrast March 23 @ 3pm please arrive 15 minutes prior for check in. [facility] [phone] 2. Bloodwork for TSH, B12, ESR, angiotensin converting enzyme (ACE) level, Lyme Ab, ANA, RF 3. Follow-up in 4 weeks

## 2013-04-05 ENCOUNTER — Encounter: Payer: Self-pay | Admitting: Neurology

## 2013-04-05 DIAGNOSIS — R292 Abnormal reflex: Secondary | ICD-10-CM | POA: Insufficient documentation

## 2013-04-05 LAB — TSH: TSH: 2.375 u[IU]/mL (ref 0.350–4.500)

## 2013-04-05 LAB — VITAMIN B12: Vitamin B-12: 658 pg/mL (ref 211–911)

## 2013-04-05 LAB — ANA: Anti Nuclear Antibody(ANA): NEGATIVE

## 2013-04-05 LAB — LYME AB/WESTERN BLOT REFLEX: B burgdorferi Ab IgG+IgM: 0.5 {ISR}

## 2013-04-10 ENCOUNTER — Telehealth: Payer: Self-pay | Admitting: Neurology

## 2013-04-10 NOTE — Telephone Encounter (Signed)
Pt called wanting to confirm if she needs to make a f/u appointment and if the MRI has been set up for her.

## 2013-04-10 NOTE — Telephone Encounter (Signed)
Spoke with patient confirm time and date for Mri also reminded her that it is on her after visit summary

## 2013-04-17 ENCOUNTER — Ambulatory Visit (HOSPITAL_COMMUNITY): Payer: BC Managed Care – PPO | Attending: Neurology

## 2013-10-24 ENCOUNTER — Encounter (HOSPITAL_COMMUNITY): Payer: Self-pay | Admitting: Emergency Medicine

## 2013-10-24 ENCOUNTER — Emergency Department (HOSPITAL_COMMUNITY)
Admission: EM | Admit: 2013-10-24 | Discharge: 2013-10-24 | Disposition: A | Discharge status: Home / Self Care | Payer: BC Managed Care – PPO | Attending: Emergency Medicine | Admitting: Emergency Medicine

## 2013-10-24 ENCOUNTER — Emergency Department (HOSPITAL_COMMUNITY): Payer: BC Managed Care – PPO

## 2013-10-24 DIAGNOSIS — J029 Acute pharyngitis, unspecified: Secondary | ICD-10-CM

## 2013-10-24 DIAGNOSIS — Z79899 Other long term (current) drug therapy: Secondary | ICD-10-CM | POA: Insufficient documentation

## 2013-10-24 DIAGNOSIS — E039 Hypothyroidism, unspecified: Secondary | ICD-10-CM | POA: Diagnosis not present

## 2013-10-24 DIAGNOSIS — R1013 Epigastric pain: Secondary | ICD-10-CM | POA: Diagnosis not present

## 2013-10-24 DIAGNOSIS — R079 Chest pain, unspecified: Secondary | ICD-10-CM | POA: Diagnosis present

## 2013-10-24 LAB — BASIC METABOLIC PANEL
Anion gap: 12 (ref 5–15)
BUN: 14 mg/dL (ref 6–23)
CO2: 23 mEq/L (ref 19–32)
CREATININE: 0.75 mg/dL (ref 0.50–1.10)
Calcium: 9.3 mg/dL (ref 8.4–10.5)
Chloride: 105 mEq/L (ref 96–112)
GFR calc non Af Amer: >90 mL/min (ref >90)
Glucose, Bld: 81 mg/dL (ref 70–99)
Potassium: 4.9 mEq/L (ref 3.7–5.3)
Sodium: 140 mEq/L (ref 137–147)

## 2013-10-24 LAB — CBC
HEMATOCRIT: 42.4 % (ref 36.0–46.0)
Hemoglobin: 14 g/dL (ref 12.0–15.0)
MCH: 30.9 pg (ref 26.0–34.0)
MCHC: 33 g/dL (ref 30.0–36.0)
MCV: 93.6 fL (ref 78.0–100.0)
Platelets: 268 10*3/uL (ref 150–400)
RBC: 4.53 MIL/uL (ref 3.87–5.11)
RDW: 12.9 % (ref 11.5–15.5)
WBC: 7.1 10*3/uL (ref 4.0–10.5)

## 2013-10-24 LAB — I-STAT TROPONIN, ED: Troponin i, poc: 0 ng/mL (ref 0.00–0.08)

## 2013-10-24 MED ORDER — OMEPRAZOLE 20 MG PO CPDR: 20.0000 mg | DELAYED_RELEASE_CAPSULE | Freq: Every day | ORAL | Status: DC

## 2013-10-24 MED ORDER — GI COCKTAIL ~~LOC~~
30.0000 mL | Freq: Once | ORAL | Status: AC
  Administered 2013-10-24: 30 mL via ORAL
  Filled 2013-10-24: qty 30

## 2013-10-24 NOTE — Discharge Instructions (Signed)
You were seen today for sore throat epigastric pain.  Everything was reassuring. There is no evidence of foreign body. You have been able to tolerate fluids and eat. If your symptoms persist, he may need to see GI to have an endoscopy to evaluate for small esophageal tear or foreign body. You should return if he has any new or worsening symptoms including difficulty eating or drinking, vomiting.  Sore Throat A sore throat is pain, burning, irritation, or scratchiness of the throat. There is often pain or tenderness when swallowing or talking. A sore throat may be accompanied by other symptoms, such as coughing, sneezing, fever, and swollen neck glands. A sore throat is often the first sign of another sickness, such as a cold, flu, strep throat, or mononucleosis (commonly known as mono). Most sore throats go away without medical treatment. CAUSES  The most common causes of a sore throat include:  A viral infection, such as a cold, flu, or mono.  A bacterial infection, such as strep throat, tonsillitis, or whooping cough.  Seasonal allergies.  Dryness in the air.  Irritants, such as smoke or pollution.  Gastroesophageal reflux disease (GERD). HOME CARE INSTRUCTIONS   Only take over-the-counter medicines as directed by your caregiver.  Drink enough fluids to keep your urine clear or pale yellow.  Rest as needed.  Try using throat sprays, lozenges, or sucking on hard candy to ease any pain (if older than 4 years or as directed).  Sip warm liquids, such as broth, herbal tea, or warm water with honey to relieve pain temporarily. You may also eat or drink cold or frozen liquids such as frozen ice pops.  Gargle with salt water (mix 1 tsp salt with 8 oz of water).  Do not smoke and avoid secondhand smoke.  Put a cool-mist humidifier in your bedroom at night to moisten the air. You can also turn on a hot shower and sit in the bathroom with the door closed for 5-10 minutes. SEEK IMMEDIATE  MEDICAL CARE IF:  You have difficulty breathing.  You are unable to swallow fluids, soft foods, or your saliva.  You have increased swelling in the throat.  Your sore throat does not get better in 7 days.  You have nausea and vomiting.  You have a fever or persistent symptoms for more than 2-3 days.  You have a fever and your symptoms suddenly get worse. MAKE SURE YOU:   Understand these instructions.  Will watch your condition.  Will get help right away if you are not doing well or get worse. Document Released: 02/20/2004 Document Revised: 12/30/2011 Document Reviewed: 09/20/2011 [facility] Patient Information 2015 [employer], [place]. This information is not intended to replace advice given to you by your health care provider. Make sure you discuss any questions you have with your health care provider.

## 2013-10-24 NOTE — ED Provider Notes (Signed)
CSN: [id number]     Arrival date & time 10/24/13  1355 History   First MD Initiated Contact with Patient 10/24/13 1446     Chief Complaint  Patient presents with  . Chest Pain     (Consider location/radiation/quality/duration/timing/severity/associated sxs/prior Treatment) HPI  This is a 58 year old female who presents with shortness throat, epigastric pain, and chest pain. Patient reports onset of symptoms 3 days ago. She thinks she may have accidentally swallowed some glass that fell onto an ice tray. Since that time she has had intermittent sore throat and epigastric pain. She denies any vomiting. She's been able to tolerate liquids and solids. She reports also intermittent pain that radiates from her epigastrium into her chest and right arm. She is currently pain-free. She states that drinking makes the pain worse. Current pain is 0/10.  Past Medical History  Diagnosis Date  . Hypothyroidism    Past Surgical History  Procedure Laterality Date  . Abdominal hysterectomy     Family History  Problem Relation Age of Onset  . Ataxia Neg Hx   . Chorea Neg Hx   . Dementia Neg Hx   . Mental retardation Neg Hx   . Migraines Neg Hx   . Multiple sclerosis Neg Hx   . Neurofibromatosis Neg Hx   . Neuropathy Neg Hx   . Parkinsonism Neg Hx   . Seizures Neg Hx   . Stroke Neg Hx    History  Substance Use Topics  . Smoking status: Never Smoker   . Smokeless tobacco: Not on file  . Alcohol Use: No   OB History   Grav Para Term Preterm Abortions TAB SAB Ect Mult Living                 Review of Systems  Constitutional: Negative for fever.  HENT: Positive for sore throat.   Respiratory: Negative for chest tightness and shortness of breath.   Cardiovascular: Positive for chest pain.  Gastrointestinal: Positive for abdominal pain. Negative for nausea and vomiting.  Genitourinary: Negative for dysuria.  Musculoskeletal: Negative for back pain.  Skin: Negative for wound.   Neurological: Negative for headaches.  Psychiatric/Behavioral: Negative for confusion.  All other systems reviewed and are negative.     Allergies  Bee pollen  Home Medications   Prior to Admission medications   Medication Sig Start Date End Date Taking? Authorizing Provider  levothyroxine (SYNTHROID, LEVOTHROID) 50 MCG tablet Take 50 mcg by mouth daily.    Historical Provider, MD  omeprazole (PRILOSEC) 20 MG capsule Take 1 capsule (20 mg total) by mouth daily. 10/24/13   Shon Baton, MD   BP 135/65  Pulse 68  Temp(Src) 98.1 F (36.7 C) (Oral)  Resp 15  Ht 5\' 7"  (1.702 m)  Wt 198 lb (89.812 kg)  BMI 31.00 kg/m2  SpO2 99% Physical Exam  Nursing note and vitals reviewed. Constitutional: She is oriented to person, place, and time. She appears well-developed and well-nourished.  HENT:  Head: Normocephalic and atraumatic.  Mouth/Throat: Oropharynx is clear and moist.  No FB noted  Eyes: Pupils are equal, round, and reactive to light.  Neck: Neck supple.  Cardiovascular: Normal rate, regular rhythm and normal heart sounds.   Pulmonary/Chest: Effort normal and breath sounds normal. No respiratory distress. She has no wheezes.  Abdominal: Soft. Bowel sounds are normal. There is no tenderness. There is no rebound.  Musculoskeletal: She exhibits no edema.  Neurological: She is alert and oriented to person, place, and time.  Skin: Skin is warm and dry.  Psychiatric: She has a normal mood and affect.    ED Course  Procedures (including critical care time) Labs Review Labs Reviewed  CBC  BASIC METABOLIC PANEL  I-STAT TROPOININ, ED    Imaging Review Dg Chest 2 View  10/24/2013   CLINICAL DATA:  Shortness of breath with right throat, chest and back pain. Possible foreign body ingestion several days ago.  EXAM: CHEST  2 VIEW  COMPARISON:  Thoracic spine radiographs 04/15/2008.  FINDINGS: Metallic BBs were placed in the suprasternal notch and over the patient's upper  lumbar spine. The heart size and mediastinal contours are normal. The lungs are clear. There is no pleural effusion or pneumothorax. There is no evidence of foreign body within the chest. The osseous structures appear normal.  IMPRESSION: No active cardiopulmonary process.  No demonstrated foreign bodies.   Electronically Signed   By: Roxy HorsemanBill  Veazey M.D.   On: 10/24/2013 14:56     EKG Interpretation  Date: 10/24/2013  Rate: 84  Rhythm: normal sinus rhythm  QRS Axis: normal  Intervals: normal  ST/T Wave abnormalities: nonspecific ST changes  Conduction Disutrbances:none  Narrative Interpretation:   Old EKG Reviewed: none available        MDM   Final diagnoses:  Sore throat  Epigastric pain   Patient presents with sore throat and epigastric pain that radiates into the chest after a possible ingestion of glass 3 days ago.  Nontoxic.  Exam is benign.  Story suggestive of esophageal irritation vs GERD.  Clinically not obstructive or impacted.  Low suspicion CP is ACS related.  Patient improved with GI cocktail.  NO FB on xray.  Discussed with patient initiation of PPI and GI follow-up if symptoms persist.  She may need an outpatient EGD.  Patient stated understanding.  After history, exam, and medical workup I feel the patient has been appropriately medically screened and is safe for discharge home. Pertinent diagnoses were discussed with the patient. Patient was given return precautions.     Shon Batonourtney F Karyn Brull, MD 10/25/13 [phone]

## 2013-10-24 NOTE — ED Notes (Signed)
Pt c/o midsternal cp with throat pain, radiating into R arm; denies SOB, but reports it hurts when breathes, pt reports that her daughter broke glass into ice tray but pt states it was a dark glass so thinks she would have seen if glass was in ice tray- unsure if she swallowed any

## 2014-01-15 ENCOUNTER — Ambulatory Visit: Payer: BC Managed Care – PPO | Admitting: Neurology

## 2014-01-16 ENCOUNTER — Telehealth: Payer: Self-pay | Admitting: Neurology

## 2014-01-16 NOTE — Telephone Encounter (Signed)
Pt no showed 01/15/14 appt w/ Dr. Karel JarvisAquino. Appt was verbally confirmed with pt during reminder calls.  Alcario DroughtErica - please send no show letter to patient / Sherri S.

## 2014-01-17 ENCOUNTER — Encounter: Payer: Self-pay | Admitting: *Deleted

## 2014-01-17 NOTE — Progress Notes (Signed)
No show letter sent for 01/15/2014 

## 2014-02-21 ENCOUNTER — Telehealth: Payer: Self-pay | Admitting: Neurology

## 2014-02-21 NOTE — Telephone Encounter (Signed)
Pt rsch appt from 03-23-14 to 02-27-14

## 2014-02-27 ENCOUNTER — Telehealth: Payer: Self-pay | Admitting: Family Medicine

## 2014-02-27 ENCOUNTER — Other Ambulatory Visit: Payer: Self-pay | Admitting: Family Medicine

## 2014-02-27 ENCOUNTER — Ambulatory Visit: Payer: Self-pay | Admitting: Neurology

## 2014-02-27 DIAGNOSIS — R413 Other amnesia: Secondary | ICD-10-CM

## 2014-02-27 NOTE — Telephone Encounter (Signed)
Tried calling patient to give her MRI appt information. No answer & no voicemail, will try again later.

## 2014-02-28 ENCOUNTER — Telehealth: Payer: Self-pay | Admitting: Neurology

## 2014-02-28 NOTE — Telephone Encounter (Signed)
02/27/14 appt marked as no show due to same day cancellation. No show letter will not be sent as pt has already been r/s / Sherri S.

## 2014-02-28 NOTE — Telephone Encounter (Signed)
Called patient again. I was able to give her appt info for her MRI @ [facility]. Sunday Feb 14th @ 2:30. [address]

## 2014-03-07 ENCOUNTER — Ambulatory Visit (INDEPENDENT_AMBULATORY_CARE_PROVIDER_SITE_OTHER): Payer: BLUE CROSS/BLUE SHIELD | Admitting: "Endocrinology

## 2014-03-07 ENCOUNTER — Encounter: Payer: Self-pay | Admitting: "Endocrinology

## 2014-03-07 VITALS — BP 135/91 | HR 94 | Wt 197.0 lb

## 2014-03-07 DIAGNOSIS — E049 Nontoxic goiter, unspecified: Secondary | ICD-10-CM

## 2014-03-07 DIAGNOSIS — E669 Obesity, unspecified: Secondary | ICD-10-CM

## 2014-03-07 DIAGNOSIS — R413 Other amnesia: Secondary | ICD-10-CM

## 2014-03-07 DIAGNOSIS — F32A Depression, unspecified: Secondary | ICD-10-CM

## 2014-03-07 DIAGNOSIS — E038 Other specified hypothyroidism: Secondary | ICD-10-CM

## 2014-03-07 DIAGNOSIS — Z8659 Personal history of other mental and behavioral disorders: Secondary | ICD-10-CM

## 2014-03-07 DIAGNOSIS — F329 Major depressive disorder, single episode, unspecified: Secondary | ICD-10-CM

## 2014-03-07 DIAGNOSIS — E063 Autoimmune thyroiditis: Secondary | ICD-10-CM

## 2014-03-07 DIAGNOSIS — I10 Essential (primary) hypertension: Secondary | ICD-10-CM | POA: Diagnosis not present

## 2014-03-07 NOTE — Progress Notes (Signed)
Subjective:  Patient Name: Jillian Wright Date of Birth: 1955/09/01  MRN: [id number]  Jillian Wright  presents to the office today, in referral as a patient of Dr. Merri Brunette, for initial endocrine consultation for the chief complaint of her hypothyroidism.   HISTORY OF PRESENT ILLNESS:   Jillian Wright is a 59 y.o. African-American woman.   Josue was unaccompanied.   1. Present illness:  A. Hypothyroidism :  1). About 2-3 years ago she was having problems with her hair falling out and having problems with short-term memory. Blood tests revealed that she was hypothyroid. She started Synthroid about two years ago. She takes 50 mcg/day of Synthroid daily.    2). Her last TSH available to me was 2.375 on 04/04/13, which was within normal limits.  B. Obesity: The issue of Jillian Wright obesity had come up when I saw her daughter for a similar complaint on 01/25/14. Jillian Wright asked me at that time if I would be willing to see her in consultation for her thyroid problem and her obesity problem. I agreed. As I found out today, her main reason for wanting to se me was to obtain a prescription for diet pills, which I usually do not prescribe.   C. Pertinent past medical history:   1). Medical problems: Poor short-term memory: She may have had a mini-stroke about 20 years ago. She often forgets to take medications. She has to write everything down. She still multitasks fairly well, but sometimes loses focus on what she is doing.   2). Surgeries: abdominal hysterectomy 2001 and rhinoplasty 2000 or 2001   3). Allergies: No known medication allergies, but is allergic to bee stings   4). GYN: No She still has ovaries.    5). Medications: Synthroid, 50 mcg/day;    6). Psych: She has intermittent depression, party situational. She did better on Lexapro than generic escitalopram. She has also had intermittent anxiety.    D. Pertinent family history   1). Thyroid disease: Some cousins have had thyroid  surgery or other thyroid problems. Her daughter has a goiter.   2). DM: Her father has T2DM.    3). ASCVD: Mother has rheumatic heart disease. Her paternal grandmother had a stroke.    4). Cancers: None   5). Psych: Dad may have Alzheimer's disease or another form of dementia.   6). Obesity: Both of her sisters, daughters, and son are heavy. Maternal grandmother was heavy.   7) Others: Her brother is autistic.   2. Pertinent Review of Systems:  Constitutional: The patient feels "pretty good - fine". Energy level is often low, but at other times can be normal. Body temperature is often fairly warm.  Eyes: Vision is good. She has glasses for reading. There are no significant eye complaints. Neck: The patient has no complaints of anterior neck swelling, soreness, tenderness,  pressure, discomfort, or difficulty swallowing.  Heart: Heart rate increases with exercise or other physical activity. The patient has no complaints of palpitations, irregular heat beats, chest pain, or chest pressure. Gastrointestinal: Bowel movents seem normal. The patient has no complaints of excessive hunger, acid reflux, upset stomach, stomach aches or pains, diarrhea, or constipation. Legs: Muscle mass and strength seem normal. There are no complaints of numbness, tingling, burning, or pain. No edema is noted. Feet: She has a few plantar warts. There are no other obvious foot problems. There are no complaints of numbness, tingling, burning, or pain. No edema is noted. GYN: She has occasional hot flashes. No galactorrhea.  PAST MEDICAL, FAMILY, AND SOCIAL HISTORY:  Past Medical History  Diagnosis Date  . Hypothyroidism     Family History  Problem Relation Age of Onset  . Ataxia Neg Hx   . Chorea Neg Hx   . Dementia Neg Hx   . Mental retardation Neg Hx   . Migraines Neg Hx   . Multiple sclerosis Neg Hx   . Neurofibromatosis Neg Hx   . Neuropathy Neg Hx   . Parkinsonism Neg Hx   . Seizures Neg Hx   . Stroke  Neg Hx      Current outpatient prescriptions:  .  levothyroxine (SYNTHROID, LEVOTHROID) 50 MCG tablet, Take 50 mcg by mouth daily., Disp: , Rfl:  .  omeprazole (PRILOSEC) 20 MG capsule, Take 1 capsule (20 mg total) by mouth daily. (Patient not taking: Reported on 03/07/2014), Disp: 30 capsule, Rfl: 0  Allergies as of 03/07/2014 - Review Complete 03/07/2014  Allergen Reaction Noted  . Bee pollen Anaphylaxis 12/19/2011    1. Work and Family: She attended several years of college. 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 She has three children. 2. Activities: She occasionally goes to the gym or swims.  3. Smoking, alcohol, or drugs: No alcohol or drugs 4. Primary Care Provider: Allean Found, MD  REVIEW OF SYSTEMS: There are no other significant problems involving Orlandria's other body systems.   Objective:  Vital Signs:  BP 135/91 mmHg  Pulse 94  Wt 197 lb (89.359 kg)   Ht Readings from Last 3 Encounters:  10/24/13  (1.702 m)  04/04/13  (1.702 m)   Wt Readings from Last 3 Encounters:  03/07/14 197 lb (89.359 kg)  10/24/13 198 lb (89.812 kg)  04/04/13 196 lb (88.905 kg)   HC Readings from Last 3 Encounters:  No data found for HC   There is no height on file to calculate BSA.  Normalized stature-for-age data available only for age 72 to 20 years. Normalized weight-for-age data available only for age 72 to 20 years.   PHYSICAL EXAM:  Constitutional: The patient appears healthy, but obese. She weighs 59 pounds more than her Ideal Body Weight. She engages well. Her affect is normal. Her insight appears to be good. She presents herself well. She is obviously quite intelligent and literate. She did become somewhat withdrawn and upset, however, when I told her that I do not prescribe diet pills. Face: The face appears normal.  Eyes: There is no obvious arcus or proptosis. Moisture appears normal. Mouth: The  oropharynx and tongue appear normal. Oral moisture is normal. Neck: The neck appears to be visibly normal. No carotid bruits are noted. The thyroid gland is very slightly enlarged at 20+ grams in size. The right lobe is within normal limits for size. The left lobe is mildly enlarged. The consistency of the thyroid gland is normal. The right lobe of the thyroid gland is very mildly tender to palpation. Lungs: The lungs are clear to auscultation. Air movement is good. Heart: Heart rate and rhythm are regular. Heart sounds S1 and S2 are normal. I did not appreciate any pathologic cardiac murmurs. Abdomen: The abdomen is enlarged. Bowel sounds are normal. There is no obvious hepatomegaly, splenomegaly, or other mass effect.  Arms: Muscle size and bulk are normal for age. Hands: There is no obvious tremor. Phalangeal and metacarpophalangeal joints are normal. Palmar muscles are normal. Palmar skin is normal. Palmar moisture is also  normal. Legs: Muscles appear normal for age. No edema is present. Neurologic: CN II-XII intact. Strength is normal for age in both the upper and lower extremities. Muscle tone is normal. Sensation to touch is normal in both the legs and feet.     LAB DATA:  No results found for this or any previous visit (from the past 504 hour(s)).   Assessment and Plan:   ASSESSMENT:  1. Acquired hypothyroidism/goiter:   A. Since she did not have thyroid surgery or thyroid irradiation and has never been on a low iodine diet, her acquired hypothyroidism must be due to the autoimmune disease, AKA Hashimoto's thyroiditis.  B. Her TSH in March 2015 was within normal limits, but represents a functional thyroid hormone level at the 25% of the normal range.  C. Her Synthroid dose is relatively low for an adult woman, indicating that she still has many functional thyrocytes that still produce thyroid hormones.   D. Her thyroid gland is only minimally enlarged today.  2. Obesity: This  condition is partly familial and partly due to lifestyle. When I saw her and her daughter for her daughter's obesity previously, Ms. Tanna FurryKilimanjaro asked me if I could help her with her obesity. What she wanted was for me to start her on a diet pill, but she dd not articulate that wish. I told her today that I am willing to work with her in terms of eating right, exercise, and the injectable Saxenda, but I do not prescribe diet pills because I have seen far too many adverse CNS effects from these pills. In her case especially, where she may have some sort of dementia that is still undiagnosed and pre-existing depression, I believe that diet pills would be contraindicated.   3. Hypertension: Her BP was high today, even after waiting 30 minute to re-check the BP. I asked her to make an appointment with Dr. Katrinka BlazingSmith to follow up on the BP. I explained to her that hypertension can be a cause of multi-infarct dementia.  4. Memory loss, short-term: She has a family history of dementia. Her dad was diagnosed with Alzheimer's disease, but I do not know how precise that diagnosis actually was. . 5. Depression: She looks well today.   PLAN:  1. Diagnostic: TFTs and TPO antibody today and in 6 months. 2. Therapeutic: Continue Synthroid 50 mcg/day for now. 3. Patient education: We discussed all of the above.  4. Follow-up: I offered her a follow up visit in 6 months, but she is not sure if she wants to return. I told her that she should have her TFTs checked every 6 months and should have her BP evaluated and treated, but that Dr. Katrinka BlazingSmith is very competent to do both.  Level of Service: This visit lasted in excess of 80 minutes. More than 50% of the visit was devoted to counseling.  David StallBRENNAN,Kenry Daubert J, MD, CDE Adult and Pediatric Endocrinology  Addendum: 03/08/13: 5. Ms Tanna FurryKilimanjaro did make a follow up appointment with me in August. She has not, however, had her lab tests drawn.

## 2014-03-07 NOTE — Patient Instructions (Addendum)
Follow up visit in 6 months if the patient is wiling to return. Please have thyroid blood tests drawn one week prior to next week.

## 2014-03-08 ENCOUNTER — Encounter: Payer: Self-pay | Admitting: "Endocrinology

## 2014-03-08 DIAGNOSIS — I10 Essential (primary) hypertension: Secondary | ICD-10-CM

## 2014-03-08 DIAGNOSIS — F329 Major depressive disorder, single episode, unspecified: Secondary | ICD-10-CM | POA: Insufficient documentation

## 2014-03-08 DIAGNOSIS — E669 Obesity, unspecified: Secondary | ICD-10-CM | POA: Insufficient documentation

## 2014-03-08 DIAGNOSIS — F32A Depression, unspecified: Secondary | ICD-10-CM | POA: Insufficient documentation

## 2014-03-08 DIAGNOSIS — E063 Autoimmune thyroiditis: Secondary | ICD-10-CM | POA: Insufficient documentation

## 2014-03-08 DIAGNOSIS — E049 Nontoxic goiter, unspecified: Secondary | ICD-10-CM | POA: Insufficient documentation

## 2014-03-08 HISTORY — DX: Essential (primary) hypertension: I10

## 2014-03-11 ENCOUNTER — Ambulatory Visit
Admission: RE | Admit: 2014-03-11 | Discharge: 2014-03-11 | Disposition: A | Discharge status: Home / Self Care | Payer: BLUE CROSS/BLUE SHIELD | Source: Ambulatory Visit | Attending: Neurology | Admitting: Neurology

## 2014-03-11 DIAGNOSIS — R413 Other amnesia: Secondary | ICD-10-CM

## 2014-03-11 MED ORDER — GADOBENATE DIMEGLUMINE 529 MG/ML IV SOLN
17.0000 mL | Freq: Once | INTRAVENOUS | Status: AC | PRN
  Administered 2014-03-11: 17 mL via INTRAVENOUS

## 2014-03-23 ENCOUNTER — Ambulatory Visit: Payer: Self-pay | Admitting: Neurology

## 2014-04-06 ENCOUNTER — Ambulatory Visit (INDEPENDENT_AMBULATORY_CARE_PROVIDER_SITE_OTHER): Payer: BLUE CROSS/BLUE SHIELD | Admitting: Neurology

## 2014-04-06 ENCOUNTER — Encounter: Payer: Self-pay | Admitting: Neurology

## 2014-04-06 VITALS — BP 138/70 | HR 80 | Ht 67.0 in | Wt 198.4 lb

## 2014-04-06 DIAGNOSIS — R51 Headache: Secondary | ICD-10-CM

## 2014-04-06 DIAGNOSIS — E038 Other specified hypothyroidism: Secondary | ICD-10-CM | POA: Insufficient documentation

## 2014-04-06 DIAGNOSIS — R0683 Snoring: Secondary | ICD-10-CM

## 2014-04-06 DIAGNOSIS — R519 Headache, unspecified: Secondary | ICD-10-CM

## 2014-04-06 DIAGNOSIS — R413 Other amnesia: Secondary | ICD-10-CM

## 2014-04-06 NOTE — Patient Instructions (Addendum)
1. Schedule Neuropsychological evaluation at [facility] 2. Schedule sleep study 3. Write your appointments down on a calendar, use an alarm for your medications 4. Follow-up in 3 months, call our office for any change in symptoms

## 2014-04-06 NOTE — Progress Notes (Signed)
NEUROLOGY FOLLOW UP OFFICE NOTE  Jillian Wright [id number]  HISTORY OF PRESENT ILLNESS: I had the pleasure of seeing Jillian Wright in follow-up in the neurology clinic on 04/06/2014.  The patient was last seen a year ago and has missed 2 appointments because she would forget about them in the course of a day. She reports her memory is worse. Her MOCA score last year was 27/30. Records and images were personally reviewed where available.  I personally reviewed MRI brain with and without contrast which shows moderate bilateral FLAIR hyperintensities, premature for age cerebral and cerebellar atrophy. There is a faint subcentimeter blush in the right paramedian upper pons characteristic of a small incidental capillary telangiectasia. Prior MRI images unavailable, but from report, it was abnormal with bilateral periatrial, periventricular, and subcortical white matter hyperintensities. On further review of laboratory results, she did have a lumbar puncture in 2010 which showed  CSF WBC 2, protein 37, positive for 4 oligoclonal bands. Lyme and VDRL negative. On her last visit a year ago, bloodwork done for TSH, B12, ESR, Lyme, ANA, ACE were normal. She tells me that "MS was ruled out in 2010," records have been requested for review.   Since her last visit, she reports memory is worse. She denies getting lost driving, but cannot find her way in some places. She cannot find her parking spot when she goes to the mall, or sometimes cannot even recall what car she brought. She forgets to take her medications. She has occasional word-finding difficulties. She does endorse family stress with her parents, stating that memory is worse when she visits them. She tells me her husband has said she has "Cinderella syndrome."  She reports frequent morning headaches that resolve over the day. No associated nausea or vomiting. She has been told by her husband that she snores loudly and at times has apneic episodes.  She denies any neck pain, no focal numbness/tingling/weakness, no loss of vision.  HPI: This is a very pleasant 59 yo RH woman with a history of hypothyroidism and prior abnormal brain MRI in 2010, who presented with worsening memory problems. She started noticing these symptoms in 2010, however this has worsened where she cannot decipher whether an event happened this morning or yesterday. She reports "the days blend in." She can recall the month or date, but not the year. 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 She can get confused if she is assigned to a different department. She occasionally forgets to take her Synthroid for several days or weeks. She has left the stove on in the past and burned food. She drives without getting lost. Her husband is in charge of their bills. She has occasional word-finding difficulties.   Prior records were personally reviewed where available. She brings an MRI brain report from 2010, images unavailable for review. MRI was abnormal with bilateral periatrial, periventricular, and subcortical, as well as right pontine white matter hyperintensities suspicious for demyelinating disease. A single faintly enhancing right pontomedullary junction lesion is also noted. There is diffuse fatty marrow infilbration of the clivus and upper cervical spine which is a finding of unclear significance. 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  PAST MEDICAL HISTORY: Past Medical History  Diagnosis Date  . Hypothyroidism   . Essential hypertension, benign 03/08/2014    MEDICATIONS: Current Outpatient Prescriptions on File Prior to Visit  Medication Sig Dispense Refill  . levothyroxine (SYNTHROID, LEVOTHROID) 50 MCG  tablet Take 50 mcg by mouth daily.     No current facility-administered medications on file prior to visit.    ALLERGIES: Allergies  Allergen Reactions  .  Bee Pollen Anaphylaxis    FAMILY HISTORY: Family History  Problem Relation Age of Onset  . Ataxia Neg Hx   . Chorea Neg Hx   . Dementia Neg Hx   . Mental retardation Neg Hx   . Migraines Neg Hx   . Multiple sclerosis Neg Hx   . Neurofibromatosis Neg Hx   . Neuropathy Neg Hx   . Parkinsonism Neg Hx   . Seizures Neg Hx   . Stroke Neg Hx     SOCIAL HISTORY: History   Social History  . Marital Status: Married    Spouse Name: N/A  . Number of Children: N/A  . Years of Education: N/A   Occupational History  . Not on file.   Social History Main Topics  . Smoking status: Never Smoker   . Smokeless tobacco: Not on file  . Alcohol Use: No  . Drug Use: No  . Sexual Activity: Not on file   Other Topics Concern  . Not on file   Social History Narrative   Lives with husband in a split level home.  Does not work.  Has 3 children.  Education: some college.    REVIEW OF SYSTEMS: Constitutional: No fevers, chills, or sweats, no generalized fatigue, change in appetite Eyes: No visual changes, double vision, eye pain Ear, nose and throat: No hearing loss, ear pain, nasal congestion, sore throat Cardiovascular: No chest pain, palpitations Respiratory:  No shortness of breath at rest or with exertion, wheezes GastrointestinaI: No nausea, vomiting, diarrhea, abdominal pain, fecal incontinence Genitourinary:  No dysuria, urinary retention or frequency Musculoskeletal:  No neck pain, back pain Integumentary: No rash, pruritus, skin lesions Neurological: as above Psychiatric: No depression, insomnia, anxiety Endocrine: No palpitations, fatigue, diaphoresis, mood swings, change in appetite, change in weight, increased thirst Hematologic/Lymphatic:  No anemia, purpura, petechiae. Allergic/Immunologic: no itchy/runny eyes, nasal congestion, recent allergic reactions, rashes  PHYSICAL EXAM: Filed Vitals:   04/06/14 1313  BP: 138/70  Pulse: 80   General: No acute distress Head:   Normocephalic/atraumatic Neck: supple, no paraspinal tenderness, full range of motion Heart:  Regular rate and rhythm Lungs:  Clear to auscultation bilaterally Back: No paraspinal tenderness Skin/Extremities: No rash, no edema Neurological Exam: alert and oriented to person, place, and time. No aphasia or dysarthria. Fund of knowledge is appropriate.  Recent and remote memory are intact.  Attention and concentration are normal.    Able to name objects and repeat phrases.  Montreal Cognitive Assessment  04/06/2014  Visuospatial/ Executive (0/5) 4  Naming (0/3) 3  Attention: Read list of digits (0/2) 2  Attention: Read list of letters (0/1) 1  Attention: Serial 7 subtraction starting at 100 (0/3) 3  Language: Repeat phrase (0/2) 2  Language : Fluency (0/1) 1  Abstraction (0/2) 2  Delayed Recall (0/5) 3  Orientation (0/6) 6  Total 27  Adjusted Score (based on education) 27    Cranial nerves: Pupils equal, round, reactive to light.  Fundoscopic exam unremarkable, no papilledema. Extraocular movements intact with no nystagmus. Visual fields full. Facial sensation intact. No facial asymmetry. Tongue, uvula, palate midline.  Motor: Bulk and tone normal, muscle strength 5/5 throughout with no pronator drift.  Sensation to light touch, temperature and vibration intact.  No extinction to double simultaneous stimulation.  Deep tendon reflexes brisk +  3 on right UE with Hoffman sign, brisk +2 on left UE and both LE, no ankle clonus. Toes downgoing.  Finger to nose testing intact.  Gait narrow-based and steady, able to tandem walk adequately.  Romberg negative.  IMPRESSION: This is a pleasant 59 yo RH woman with a history of hypothyroidism who presented with worsening memory since 2010 that has progressed. She had a brain MRI in 2010 that was abnormal, concerning for demyelinating disease, but it appears she was lost to follow-up at that time, with CSF showing 4 oligoclonal bands. Records from her previous  neurologist will be requested for review. Repeat MRI brain with and without contrast appears to show similar white matter changes, no significant abnormal enhancement. There is premature atrophy for age. I am concerned about demyelinating disease causing her memory changes, however she also endorses significant stress. MOCA today is normal 27/30, similar to last visit a year ago. Neuropsychological evaluation will be ordered to further evaluate her symptoms. She also reports snoring and morning headaches, sleep study will be ordered to assess for sleep apnea, which can also cause cognitive changes. She will follow-up in 3 months.   Thank you for allowing me to participate in her care.  Please do not hesitate to call for any questions or concerns.  The duration of this appointment visit was 25 minutes of face-to-face time with the patient.  Greater than 50% of this time was spent in counseling, explanation of diagnosis, planning of further management, and coordination of care.   Ellouise Newer, M.D.   CC: Dr. Tamala Julian

## 2014-05-03 ENCOUNTER — Telehealth: Payer: Self-pay | Admitting: Family Medicine

## 2014-05-03 NOTE — Telephone Encounter (Signed)
I called patient after receiving correspondence from [facility] stating that they have been trying to contact the patient to set-up an appt without any return calls.  I did speak with her & she states that she doesn't have an answering machine so a message couldn't be left. I did advise her to call [facility] to get her appt set-up asap. I did give her their telephone number. She also wanted to make Dr. Karel JarvisAquino aware that her memory is getting worse & that she is getting lost a lot in parking lots she states that this has been happening but she forgot to mention it at her last ov.

## 2014-06-07 ENCOUNTER — Telehealth: Payer: Self-pay | Admitting: Neurology

## 2014-06-07 NOTE — Telephone Encounter (Signed)
Report from Neuropsychological evaluation at [facility] reviewed 05/22/2014:  Impression: MILD NEUROCOGNITIVE DISORDER, UNSPECIFIED (RULE OUT MILD NEUROCOGNITIVE DISORDER DUE TO MULTIPLE SCLEROSIS). The patient's cognitive profile is consistent with subcortical dysfunction. She meets criteria for a mild neurocognitive disorder but not a dementia. I do not believe the testing results can be explained by situational stress or psychological factors. Furthermore, as as been noted by Dr. Karel JarvisAquino, the patient's abnormal MRI findings and oligoclonal bands in CSF are highly concerning for demyelinating disease/MS. Therefore, the etiology of her mild neurocognitive disorder is likely MS. If a demyelinating process is ruled out, a vascular etiology could be considered.

## 2014-06-13 ENCOUNTER — Ambulatory Visit (HOSPITAL_BASED_OUTPATIENT_CLINIC_OR_DEPARTMENT_OTHER): Payer: BLUE CROSS/BLUE SHIELD | Attending: Neurology | Admitting: *Deleted

## 2014-06-13 VITALS — Ht 67.5 in | Wt 198.0 lb

## 2014-06-13 DIAGNOSIS — I493 Ventricular premature depolarization: Secondary | ICD-10-CM | POA: Diagnosis not present

## 2014-06-13 DIAGNOSIS — R0683 Snoring: Secondary | ICD-10-CM

## 2014-06-13 DIAGNOSIS — G4710 Hypersomnia, unspecified: Secondary | ICD-10-CM | POA: Diagnosis present

## 2014-06-13 DIAGNOSIS — G4733 Obstructive sleep apnea (adult) (pediatric): Secondary | ICD-10-CM | POA: Diagnosis not present

## 2014-06-13 DIAGNOSIS — I491 Atrial premature depolarization: Secondary | ICD-10-CM | POA: Insufficient documentation

## 2014-06-21 DIAGNOSIS — G4733 Obstructive sleep apnea (adult) (pediatric): Secondary | ICD-10-CM | POA: Diagnosis not present

## 2014-06-21 NOTE — Sleep Study (Signed)
   NAME: Jillian LawsSybil G Holan DATE OF BIRTH:  12-26-1955 MEDICAL RECORD NUMBER [id number]  LOCATION: [facility]  PHYSICIAN: Barbaraann ShareCLANCE,KEITH M  DATE OF STUDY: 06/13/2014  SLEEP STUDY TYPE: Nocturnal Polysomnogram               REFERRING PHYSICIAN: Van ClinesAquino, Karen M, MD  INDICATION FOR STUDY: Hypersomnia with sleep apnea  EPWORTH SLEEPINESS SCORE:  6 HEIGHT: 5' 7.5" (171.5 cm)  WEIGHT: 198 lb (89.812 kg)    Body mass index is 30.54 kg/(m^2).  NECK SIZE: 15 in.  MEDICATIONS: Reviewed in the sleep record  SLEEP ARCHITECTURE: The patient had a total sleep time of 323 minutes, with no slow-wave sleep and only 62 minutes of REM. Sleep onset latency was normal at 28 minutes, as was REM onset. Sleep efficiency was mildly reduced at 81%.  RESPIRATORY DATA: The patient was found to have 26 apneas and 26 hypopneas, giving her an AHI of 10 events per hour. The events occurred primarily during REM and supine sleep, and there was loud snoring noted throughout.  OXYGEN DATA: There was oxygen desaturation as low as 79% with the patient's obstructive events  CARDIAC DATA: Occasional PAC and PVC noted  MOVEMENT/PARASOMNIA: No significant periodic leg movements or abnormal behaviors were noted.  IMPRESSION/ RECOMMENDATION:    1) mild obstructive sleep apnea/hypopnea syndrome, with an AHI of 10 events per hour and oxygen desaturation as low as 79%. Treatment for this degree of sleep apnea can include a trial of weight loss alone, upper airway surgery, dental appliance, and also sleep apnea. Clinical correlation is suggested.  2) occasional PAC and PVC noted, but no clinically significant arrhythmias were seen     Barbaraann ShareLANCE,KEITH M Diplomate, American Board of Sleep Medicine  ELECTRONICALLY SIGNED ON:  06/21/2014, 1:37 PM [facility] PH: [phone]   FX: [phone] ACCREDITED BY THE AMERICAN ACADEMY OF SLEEP MEDICINE

## 2014-07-03 ENCOUNTER — Encounter: Payer: Self-pay | Admitting: Neurology

## 2014-07-03 ENCOUNTER — Telehealth: Payer: Self-pay | Admitting: Neurology

## 2014-07-03 ENCOUNTER — Ambulatory Visit (INDEPENDENT_AMBULATORY_CARE_PROVIDER_SITE_OTHER): Payer: BLUE CROSS/BLUE SHIELD | Admitting: Neurology

## 2014-07-03 VITALS — BP 118/78 | HR 78 | Resp 16 | Ht 67.0 in | Wt 203.0 lb

## 2014-07-03 DIAGNOSIS — R292 Abnormal reflex: Secondary | ICD-10-CM | POA: Diagnosis not present

## 2014-07-03 DIAGNOSIS — R413 Other amnesia: Secondary | ICD-10-CM | POA: Diagnosis not present

## 2014-07-03 NOTE — Patient Instructions (Signed)
1. MRI cervical and thoracic spine with and without contrast 2. Start Aricept 5mg  daily 3. Follow-up in 2 months

## 2014-07-03 NOTE — Progress Notes (Signed)
NEUROLOGY FOLLOW UP OFFICE NOTE  Jillian Wright [id number]  HISTORY OF PRESENT ILLNESS: I had the pleasure of seeing Jillian Wright in follow-up in the neurology clinic on 07/03/2014.  The patient was last seen 3 months ago for worsening memory. Since her last visit, she underwent Neuropsychological evaluation. She was not very happy with the written report and states some of her statements were misquoted. Impression is as follows:  Impression: MILD NEUROCOGNITIVE DISORDER, UNSPECIFIED (RULE OUT MILD NEUROCOGNITIVE DISORDER DUE TO MULTIPLE SCLEROSIS). The patient's cognitive profile is consistent with subcortical dysfunction. She meets criteria for a mild neurocognitive disorder but not a dementia. I do not believe the testing results can be explained by situational stress or psychological factors. Furthermore, as as been noted by Dr. Delice Wright, the patient's abnormal MRI findings and oligoclonal bands in CSF are highly concerning for demyelinating disease/MS. Therefore, the etiology of her mild neurocognitive disorder is likely MS. If a demyelinating process is ruled out, a vascular etiology could be considered.  She continues to have problems finding her way in some places, or remembering where she parked. She continues to have family stress and has been mostly trying to avoid family members (ie her father). She cannot recall what was told to her about diagnosis of MS in the past, she only recalls one episode where she had some speech difficulties and body numbness while in a conference several years ago, otherwise she denies any dysarthria, diplopia, vision loss, focal numbness/tingling/weakness. She had a sleep study which showed mild obstructive sleep apnea/hypopnea syndrome, with AHI of 10 events per hour and O2 desaturation as low as 79%. Recommendation was a trial of weight loss, upper airway surgery, dental appliance, or CPAP.   HPI: [name], who presented with worsening memory problems. She started noticing these symptoms in 2010, however this has worsened where she cannot decipher whether an event happened this morning or yesterday. She reports "the days blend in." She can recall the month or date, but not the year. 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 She can get confused if she is assigned to a different department. She occasionally forgets to take her Synthroid for several days or weeks. She has left the stove on in the past and burned food. She drives without getting lost. Her husband is in charge of their bills. She has occasional word-finding difficulties.   Prior records were personally reviewed where available. She brings an MRI brain report from 2010, images unavailable for review. MRI was abnormal with bilateral periatrial, periventricular, and subcortical, as well as right pontine white matter hyperintensities suspicious for demyelinating disease. A single faintly enhancing right pontomedullary junction lesion is also noted. There is diffuse fatty marrow infilbration of the clivus and upper cervical spine which is a finding of unclear significance. 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  I personally reviewed MRI brain with and without contrast which shows moderate bilateral FLAIR hyperintensities, premature for age cerebral and cerebellar atrophy. There is a faint subcentimeter blush in the right paramedian upper pons characteristic of a small incidental capillary telangiectasia. Prior MRI images unavailable, but from report, it was abnormal with bilateral periatrial, periventricular, and subcortical white matter hyperintensities. On further review of laboratory results, she did have a lumbar puncture in 2010 which showed CSF WBC 2, protein 37, positive  for 4  oligoclonal bands. Lyme and VDRL negative. On her last visit a year ago, bloodwork done for TSH, B12, ESR, Lyme, ANA, ACE were normal. She tells me that "MS was ruled out in 2010," records have been requested for review.   PAST MEDICAL HISTORY: Past Medical History  Diagnosis Date  . Hypothyroidism   . Essential hypertension, benign 03/08/2014    MEDICATIONS: Current Outpatient Prescriptions on File Prior to Visit  Medication Sig Dispense Refill  . escitalopram (LEXAPRO) 5 MG tablet   0  . levothyroxine (SYNTHROID, LEVOTHROID) 50 MCG tablet Take 50 mcg by mouth daily.     No current facility-administered medications on file prior to visit.    ALLERGIES: Allergies  Allergen Reactions  . Bee Pollen Anaphylaxis    FAMILY HISTORY: Family History  Problem Relation Age of Onset  . Ataxia Neg Hx   . Chorea Neg Hx   . Dementia Neg Hx   . Mental retardation Neg Hx   . Migraines Neg Hx   . Multiple sclerosis Neg Hx   . Neurofibromatosis Neg Hx   . Neuropathy Neg Hx   . Parkinsonism Neg Hx   . Seizures Neg Hx   . Stroke Neg Hx     SOCIAL HISTORY: History   Social History  . Marital Status: Married    Spouse Name: N/A  . Number of Children: N/A  . Years of Education: N/A   Occupational History  . Not on file.   Social History Main Topics  . Smoking status: Never Smoker   . Smokeless tobacco: Not on file  . Alcohol Use: No  . Drug Use: No  . Sexual Activity: Not on file   Other Topics Concern  . Not on file   Social History Narrative   Lives with husband in a split level home.  Does not work.  Has 3 children.  Education: some college.    REVIEW OF SYSTEMS: Constitutional: No fevers, chills, or sweats, no generalized fatigue, change in appetite Eyes: No visual changes, double vision, eye pain Ear, nose and throat: No hearing loss, ear pain, nasal congestion, sore throat Cardiovascular: No chest pain, palpitations Respiratory:  No shortness of breath at rest or  with exertion, wheezes GastrointestinaI: No nausea, vomiting, diarrhea, abdominal pain, fecal incontinence Genitourinary:  No dysuria, urinary retention or frequency Musculoskeletal:  No neck pain, back pain Integumentary: No rash, pruritus, skin lesions Neurological: as above Psychiatric: No depression, insomnia, anxiety Endocrine: No palpitations, fatigue, diaphoresis, mood swings, change in appetite, change in weight, increased thirst Hematologic/Lymphatic:  No anemia, purpura, petechiae. Allergic/Immunologic: no itchy/runny eyes, nasal congestion, recent allergic reactions, rashes  PHYSICAL EXAM: Filed Vitals:   07/03/14 1101  BP: 118/78  Pulse: 78  Resp: 16   General: No acute distress Head:  Normocephalic/atraumatic Neck: supple, no paraspinal tenderness, full range of motion Heart:  Regular rate and rhythm Lungs:  Clear to auscultation bilaterally Back: No paraspinal tenderness Skin/Extremities: No rash, no edema Neurological Exam: alert and oriented to person, place, and time. No aphasia or dysarthria. Fund of knowledge is appropriate.  Recent and remote memory are intact. 3/3 delayed recall. Attention and concentration are normal.    Able to name objects and repeat phrases. Cranial nerves: Pupils equal, round, reactive to light. Fundoscopic exam unremarkable, no papilledema. Extraocular movements intact with no nystagmus. Visual fields full. Facial sensation intact. No facial asymmetry. Tongue, uvula, palate midline. Motor: Bulk and tone normal, muscle strength 5/5 throughout with no pronator drift.  Sensation to light touch, temperature and vibration intact. No extinction to double simultaneous stimulation. Deep tendon reflexes brisk +3 on both UE with bilateral Hoffman sign, brisk +2 on both LE, no ankle clonus. Toes downgoing. Finger to nose testing intact. Gait narrow-based and steady, able to tandem walk adequately. Romberg negative.  IMPRESSION: This is a pleasant 59  yo RH woman with a history of hypothyroidism who presented with worsening memory since 2010 that has progressed. She had a brain MRI in 2010 that was abnormal, concerning for demyelinating disease, but it appears she was lost to follow-up at that time, with CSF showing 4 oligoclonal bands. Repeat MRI brain with and without contrast appears to show similar white matter changes, no significant abnormal enhancement. There is premature atrophy for age. Neuropsychological evaluation did not see evidence of dementia, but she was noted to have mild neurocognitive disorder with subcortical dysfunction, possibly due to MS. She denies any clinical symptoms of MS, but does have hyperreflexia. An MRI cervical and thoracic spine with and without contrast will be ordered to assess for spinal lesions. If present, we will plan to start treatment for MS. We discussed her cognitive symptoms, Aricept may help, she will start low dose 5m daily. Side effects were discussed. Sleep study showed mild sleep apnea, weight loss may help. She will follow-up after the MRI.   Thank you for allowing me to participate in her care.  Please do not hesitate to call for any questions or concerns.  The duration of this appointment visit was 25 minutes of face-to-face time with the patient.  Greater than 50% of this time was spent in counseling, explanation of diagnosis, planning of further management, and coordination of care.   KEllouise Newer M.D.   CC: Dr. STamala Julian

## 2014-07-03 NOTE — Telephone Encounter (Signed)
Pt called and wanted to let Dr Karel JarvisAquino know she was taking Klonazapan (spelling) she forgot to tell her/Dawn CB# [phone]

## 2014-07-05 NOTE — Telephone Encounter (Signed)
After trying to call patient several times with no answer & no voicemail I will mail her a letter asking her to give [place] a call for medication clarification of the below msg. I will also include in the letter notifying her of her appt information for MRI's scheduled on 07/19/14.

## 2014-07-13 ENCOUNTER — Telehealth: Payer: Self-pay | Admitting: Neurology

## 2014-07-13 NOTE — Telephone Encounter (Signed)
Returned patient's call. No answer,will try again later.

## 2014-07-13 NOTE — Telephone Encounter (Signed)
Pt called and wanted a call back in regards to getting a copy of Dr Aquino's evaluation on her/Dawn CB# [phone]

## 2014-07-13 NOTE — Telephone Encounter (Signed)
I spoke she wanted to know what Dr. Karel Jarvis thought of her neuropsych eval & also what her dx actually was. I did explain to patient that she did show to have a mild neurocognitive disorder & also possible MS. The MRI scans were ordered to see if there were any spinal lesions, if spinal lesions are shown then she will start MS medication. Patient stated she cancelled her scans because she states she is going to be out of time. I did advise her to go ahead and call scheduling back to get scans rescheduled as this will aide in her dx her sxs & treatment. Patient stated she would call back & reschedule MRI's.

## 2014-07-19 ENCOUNTER — Inpatient Hospital Stay (HOSPITAL_COMMUNITY): Admission: RE | Admit: 2014-07-19 | Payer: BLUE CROSS/BLUE SHIELD | Source: Ambulatory Visit

## 2014-07-19 ENCOUNTER — Ambulatory Visit (HOSPITAL_COMMUNITY): Payer: BLUE CROSS/BLUE SHIELD

## 2014-08-16 ENCOUNTER — Ambulatory Visit: Payer: BLUE CROSS/BLUE SHIELD | Admitting: "Endocrinology

## 2014-09-05 ENCOUNTER — Ambulatory Visit: Payer: BLUE CROSS/BLUE SHIELD | Admitting: "Endocrinology

## 2014-09-10 ENCOUNTER — Ambulatory Visit: Payer: BLUE CROSS/BLUE SHIELD | Admitting: "Endocrinology

## 2014-09-25 ENCOUNTER — Ambulatory Visit: Payer: BLUE CROSS/BLUE SHIELD | Admitting: "Endocrinology

## 2014-10-17 ENCOUNTER — Other Ambulatory Visit: Payer: Self-pay | Admitting: *Deleted

## 2014-10-17 ENCOUNTER — Telehealth: Payer: Self-pay | Admitting: Neurology

## 2014-10-17 MED ORDER — DONEPEZIL HCL 5 MG PO TABS: 5.0000 mg | ORAL_TABLET | Freq: Every day | ORAL | Status: DC

## 2014-10-17 NOTE — Telephone Encounter (Signed)
Pt requests a prescription refill/aracett/ to be sent to [facility] on [address]/[place]//[phone] or  [phone]

## 2014-10-17 NOTE — Telephone Encounter (Signed)
Rx sent 

## 2014-10-25 ENCOUNTER — Telehealth: Payer: Self-pay | Admitting: Neurology

## 2014-10-25 NOTE — Telephone Encounter (Signed)
Tried returning patient's call, no answer. Will try again.

## 2014-10-25 NOTE — Telephone Encounter (Signed)
Pt called and stated that Aricept medication made her really sick and needs a call back/Dawn CB# [phone]

## 2014-10-26 NOTE — Telephone Encounter (Signed)
Tried calling patient again, no answer. Couldn't leave msg.

## 2015-10-20 IMAGING — CR DG CHEST 2V
2 series · 2 of 2 positions shown · non-contrast
Comparison: Thoracic spine radiographs 04/15/2008.

CLINICAL DATA: Shortness of breath with right throat, chest and
back pain. Possible foreign body ingestion several days ago.

EXAM:
CHEST  2 VIEW

[w chest pa]
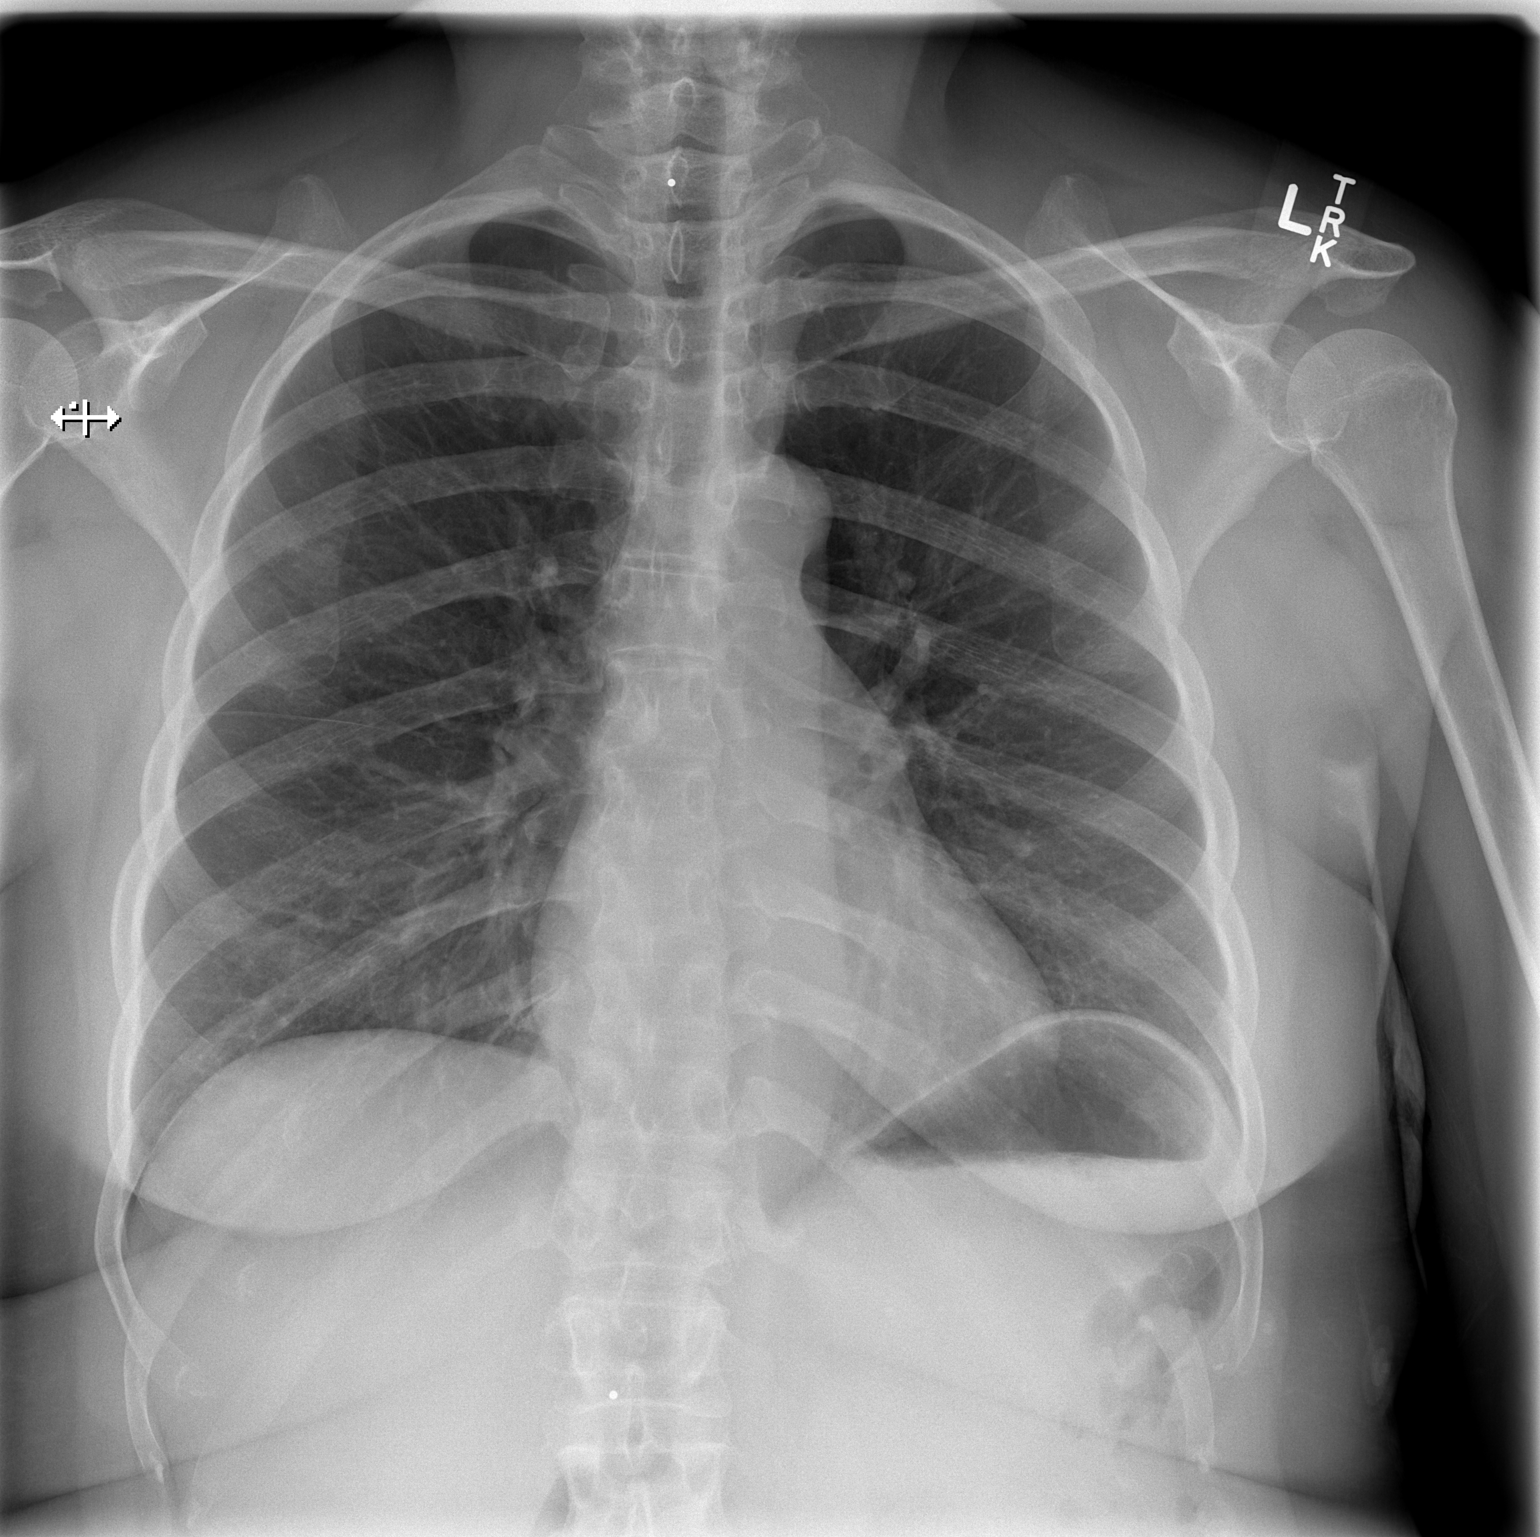

[w chest lat]
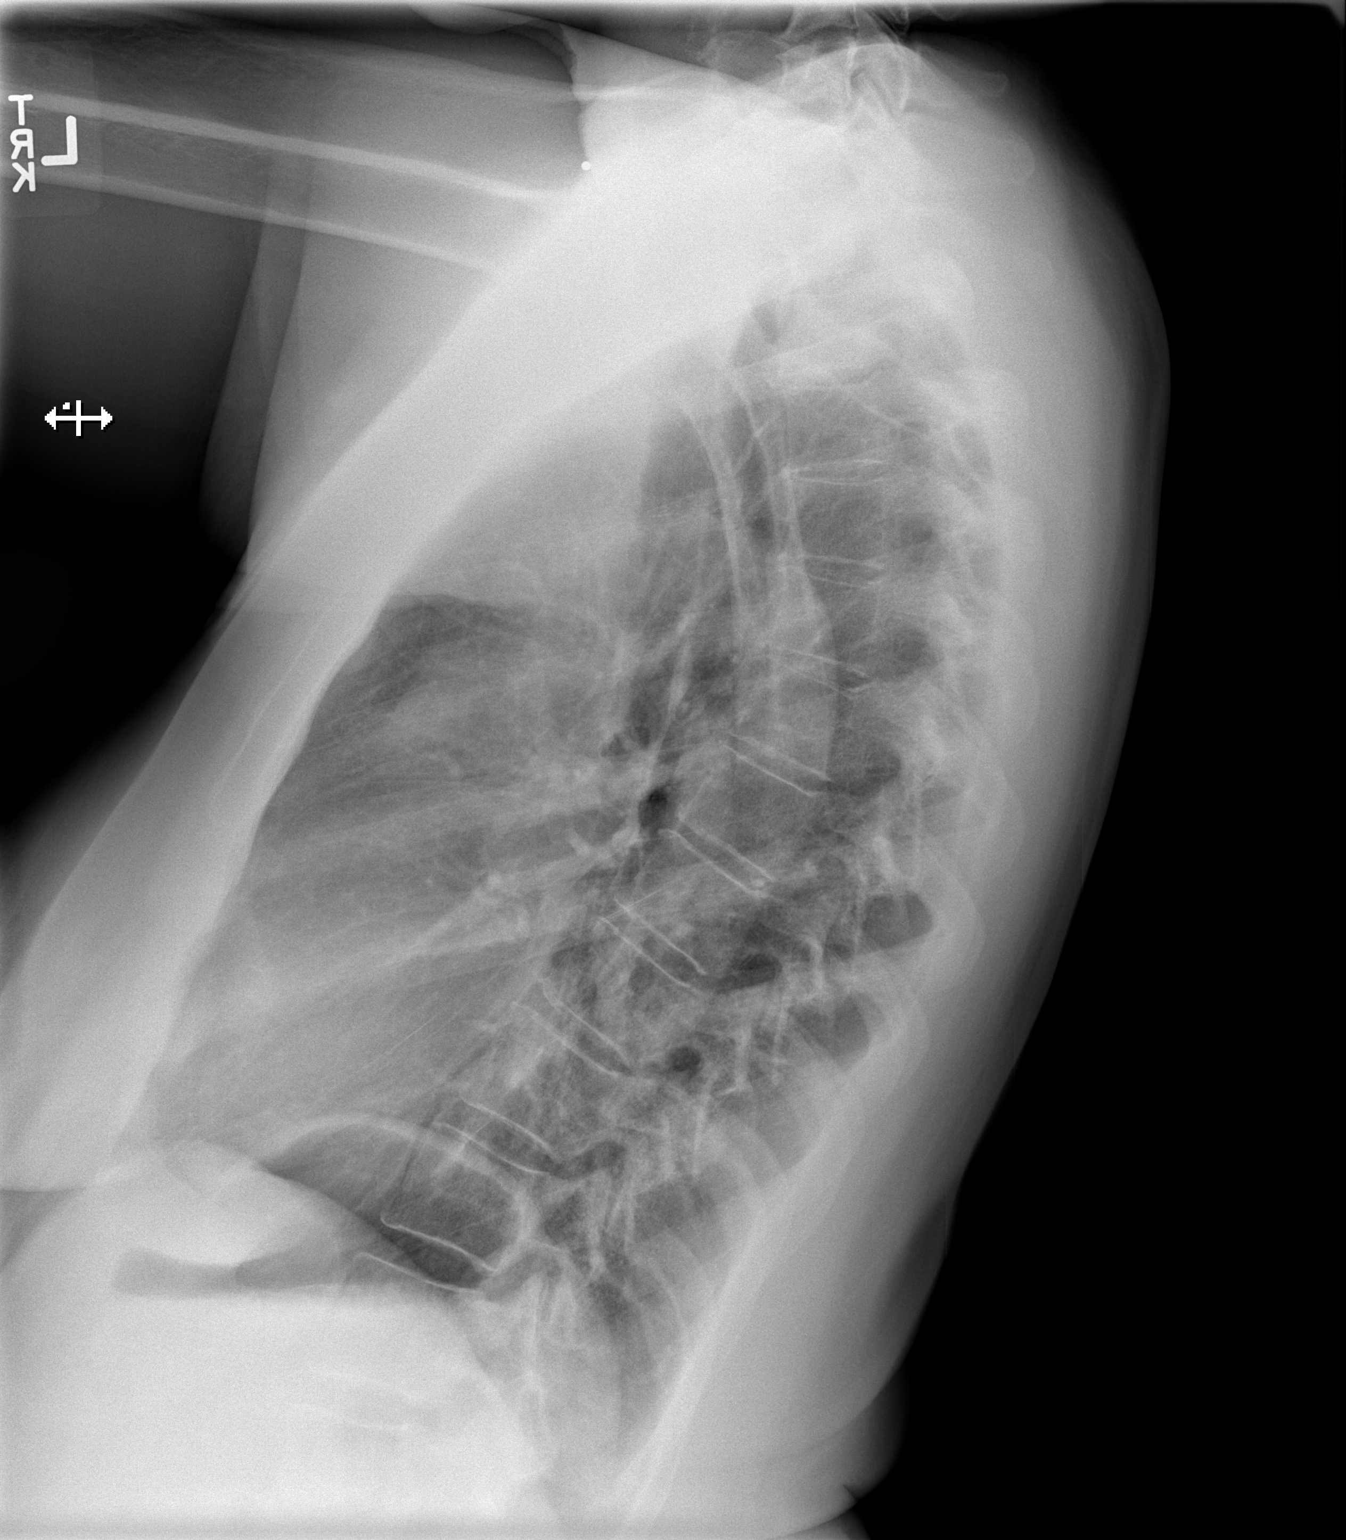

[2 of 2 positions shown; findings below may reference images not displayed]

FINDINGS: Metallic BBs were placed in the suprasternal notch and over the
patient's upper lumbar spine. The heart size and mediastinal
contours are normal. The lungs are clear. There is no pleural
effusion or pneumothorax. There is no evidence of foreign body
within the chest. The osseous structures appear normal.
IMPRESSION: No active cardiopulmonary process.  No demonstrated foreign bodies.

## 2016-12-15 ENCOUNTER — Ambulatory Visit: Payer: BLUE CROSS/BLUE SHIELD | Admitting: Neurology

## 2017-03-30 DIAGNOSIS — J069 Acute upper respiratory infection, unspecified: Secondary | ICD-10-CM | POA: Diagnosis not present

## 2017-03-30 DIAGNOSIS — R413 Other amnesia: Secondary | ICD-10-CM | POA: Diagnosis not present

## 2017-04-02 ENCOUNTER — Other Ambulatory Visit: Payer: Self-pay | Admitting: Family Medicine

## 2017-04-02 DIAGNOSIS — Z139 Encounter for screening, unspecified: Secondary | ICD-10-CM

## 2017-04-06 ENCOUNTER — Ambulatory Visit
Admission: RE | Admit: 2017-04-06 | Discharge: 2017-04-06 | Disposition: A | Discharge status: Home / Self Care | Payer: BLUE CROSS/BLUE SHIELD | Source: Ambulatory Visit | Attending: Family Medicine | Admitting: Family Medicine

## 2017-04-06 DIAGNOSIS — Z1231 Encounter for screening mammogram for malignant neoplasm of breast: Secondary | ICD-10-CM | POA: Diagnosis not present

## 2017-04-06 DIAGNOSIS — Z139 Encounter for screening, unspecified: Secondary | ICD-10-CM

## 2017-04-29 DIAGNOSIS — E7800 Pure hypercholesterolemia, unspecified: Secondary | ICD-10-CM | POA: Diagnosis not present

## 2017-04-29 DIAGNOSIS — E039 Hypothyroidism, unspecified: Secondary | ICD-10-CM | POA: Diagnosis not present

## 2017-05-12 ENCOUNTER — Ambulatory Visit: Payer: BLUE CROSS/BLUE SHIELD | Admitting: Neurology

## 2017-05-19 DIAGNOSIS — H903 Sensorineural hearing loss, bilateral: Secondary | ICD-10-CM | POA: Diagnosis not present

## 2017-10-13 DIAGNOSIS — H40013 Open angle with borderline findings, low risk, bilateral: Secondary | ICD-10-CM | POA: Diagnosis not present

## 2017-10-13 DIAGNOSIS — H2513 Age-related nuclear cataract, bilateral: Secondary | ICD-10-CM | POA: Diagnosis not present

## 2017-10-13 DIAGNOSIS — H04123 Dry eye syndrome of bilateral lacrimal glands: Secondary | ICD-10-CM | POA: Diagnosis not present

## 2018-01-24 ENCOUNTER — Encounter: Payer: Self-pay | Admitting: Neurology

## 2018-02-28 ENCOUNTER — Other Ambulatory Visit: Payer: Self-pay | Admitting: Family Medicine

## 2018-02-28 DIAGNOSIS — Z1231 Encounter for screening mammogram for malignant neoplasm of breast: Secondary | ICD-10-CM

## 2018-03-31 ENCOUNTER — Ambulatory Visit: Payer: Commercial Managed Care - PPO | Admitting: Neurology

## 2018-04-08 ENCOUNTER — Ambulatory Visit: Payer: Commercial Managed Care - PPO

## 2019-02-02 ENCOUNTER — Telehealth (INDEPENDENT_AMBULATORY_CARE_PROVIDER_SITE_OTHER): Payer: 59 | Admitting: Neurology

## 2019-02-02 ENCOUNTER — Other Ambulatory Visit: Payer: Self-pay

## 2019-02-02 ENCOUNTER — Encounter: Payer: Self-pay | Admitting: Neurology

## 2019-02-02 VITALS — Ht 67.0 in | Wt 185.0 lb

## 2019-02-02 DIAGNOSIS — R9089 Other abnormal findings on diagnostic imaging of central nervous system: Secondary | ICD-10-CM

## 2019-02-02 DIAGNOSIS — R413 Other amnesia: Secondary | ICD-10-CM | POA: Diagnosis not present

## 2019-02-02 NOTE — Progress Notes (Signed)
Virtual Visit via Video Note The purpose of this virtual visit is to provide medical care while limiting exposure to the novel coronavirus.    Consent was obtained for video visit:  Yes.   Answered questions that patient had about telehealth interaction:  Yes.   I discussed the limitations, risks, security and privacy concerns of performing an evaluation and management service by telemedicine. I also discussed with the patient that there may be a patient responsible charge related to this service. The patient expressed understanding and agreed to proceed.  Pt location: Home Physician Location: office Name of referring provider:  Carol Ada, MD I connected with Donny Pique at patients initiation/request on 02/02/2019 at  9:00 AM EST by video enabled telemedicine application and verified that I am speaking with the correct person using two identifiers. Pt MRN:  [id number] Pt DOB:  20-Dec-1955 Video Participants:  Donny Pique   History of Present Illness:  The patient was seen as a virtual video visit on 02/02/2019. She is a 64 year old right-handed woman  hypothyroidism and prior abnormal brain MRI in 2010, who was evaluated in 2015 for worsening memory problems. She was lost to follow-up since 2016 and presents today for evaluation of memory loss. Records from prior evaluation were reviewed. MRI brain from 02/2014 showed premature for age cerebral and cerebellar atrophy with white matter disease and scattered chronic lacunes, felt to be consistent with hypertensive related cerebral vascular disease. She had Neurocognitive testing in 2016 with a diagnosis of Mild Neurocognitive disorder, rule out mild neurocognitive disorder due to MS. Cognitive profile was consistent with subcortical dysfunction. If demyelinating process is ruled out, a vascular etiology could be considered. We had discussed doing an MRI of the cervical and thoracic spine to assess for demyelinating lesions, however  she was lost to follow-up.  She presents today quite in distress. Unfortunately her husband passed away from Covid-19 infection 3 weeks ago and she is understandably upset. She states her memory is good enough that it is aggravating when she is doing memory testing. She cannot remember numbers, her short-term memory is bad. Days go by fast and her timing is off. She cannot recall the year of an event, thinking it happened at a different time. She states her husband's death has been the most devastating thing. He did most of the driving. She reports getting lost inside the grocery and trying to find her car in the parking lot. She would forget her medications, a few months ago she went 2-3 days without her thyroid medication and would get so tired. She now writes everything down. She was restarted on Lexapro several weeks ago. She seldom needs lorazepam, and has noticed she responds better to to brand Ativan. She reports headaches on the left side, vertex, and back of her head that comes and goes, no associated nausea/vomiting. No focal numbness/tingling/weakness. She has fallen twice in the past 2 days, mostly when she turns too fast. No neck pain. No bowel/bladder dysfunction. She was previously on Donepezil which caused vertigo, it was restarted by her PCP 2 months ago but she did not take it.    History on Initial Assessment 03/2013: This is a very pleasant 64 yo RH woman with a history of hypothyroidism and prior abnormal brain MRI in 2010, who presented with worsening memory problems. She started noticing these symptoms in 2010, however this has worsened where she cannot decipher whether an event happened this morning or yesterday. She reports "the days  blend in." She can recall the month or date, but not the year. 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 She can get confused if she is assigned to a different department. She occasionally forgets  to take her Synthroid for several days or weeks. She has left the stove on in the past and burned food. She drives without getting lost. Her husband is in charge of their bills. She has occasional word-finding difficulties.   Prior records were personally reviewed where available. She brings an MRI brain report from 2010, images unavailable for review. MRI was abnormal with bilateral periatrial, periventricular, and subcortical, as well as right pontine white matter hyperintensities suspicious for demyelinating disease. A single faintly enhancing right pontomedullary junction lesion is also noted. There is diffuse fatty marrow infilbration of the clivus and upper cervical spine which is a finding of unclear significance. 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  I personally reviewed MRI brain with and without contrast which shows moderate bilateral FLAIR hyperintensities, premature for age cerebral and cerebellar atrophy. There is a faint subcentimeter blush in the right paramedian upper pons characteristic of a small incidental capillary telangiectasia. Prior MRI images unavailable, but from report, it was abnormal with bilateral periatrial, periventricular, and subcortical white matter hyperintensities. On further review of laboratory results, she did have a lumbar puncture in 2010 which showed CSF WBC 2, protein 37, positive for 4 oligoclonal bands. Lyme and VDRL negative. On her last visit a year ago, bloodwork done for TSH, B12, ESR, Lyme, ANA, ACE were normal. She tells me that "MS was ruled out in 2010," records have been requested for review.   PAST MEDICAL HISTORY:      Past Medical History:  Diagnosis Date  . Essential hypertension, benign 03/08/2014  . Hypothyroidism    MEDICATIONS:        Current Outpatient Medications on File Prior to Visit  Medication Sig Dispense Refill  . escitalopram (LEXAPRO) 5 MG tablet Take 5 mg by mouth daily.    0  . levothyroxine (SYNTHROID, LEVOTHROID) 50 MCG tablet Take 50 mcg by mouth daily.    Marland Kitchen LORazepam (ATIVAN) 0.5 MG tablet Take 0.5 mg by mouth as needed for anxiety.     No current facility-administered medications on file prior to visit.   ALLERGIES:      Allergies  Allergen Reactions  . Bee Pollen Anaphylaxis   FAMILY HISTORY:       Family History  Problem Relation Age of Onset  . Ataxia Neg Hx   . Chorea Neg Hx   . Dementia Neg Hx   . Mental retardation Neg Hx   . Migraines Neg Hx   . Multiple sclerosis Neg Hx   . Neurofibromatosis Neg Hx   . Neuropathy Neg Hx   . Parkinsonism Neg Hx   . Seizures Neg Hx   . Stroke Neg Hx   . Breast cancer Neg Hx     Observations/Objective:   Vitals:   02/02/19 0758  Weight: 185 lb (83.9 kg)  Height: _0  (1.702 m)   GEN:  The patient appears stated age and is in NAD.  Neurological examination: Patient is awake, alert, oriented x 3. No aphasia or dysarthria. Intact fluency and comprehension. Remote and recent memory intact. SLUMS 30/30. Cranial nerves: Extraocular movements intact with no nystagmus. No facial asymmetry. Motor: moves all extremities symmetrically, at least anti-gravity x 4. No incoordination on finger to nose  testing. Gait: narrow-based and steady, able to tandem walk adequately. Negative Romberg test.  [employer] Mental Exam 02/02/2019  Weekday Correct 1  Current year 1  What state are we in? 1  Amount spent 1  Amount left 2  # of Animals 3  5 objects recall 5  Number series 2  Hour markers 2  Time correct 2  Placed X in triangle correctly 1  Largest Figure 1  Name of female 2  Date back to work 2  Type of work 2  State she lived in 2  Total score 30     Assessment and Plan:   This is a pleasant 64 yo RH woman with a history of hypothyroidism previously evaluated in our office for memory loss in 2015. Memory changes started in 2010, she had an abnormal MRI in 2010 concerning for demyelinating  disease, but it appears she was lost to follow-up at that time, with CSF showing 4 oligoclonal bands. Repeat MRI brain with and without contrast in 2016 appeared to show similar white matter changes, no significant abnormal enhancement. There is premature atrophy for age. Neuropsychological evaluation in 2016 indicated mild neurocognitive disorder with subcortical dysfunction, possibly due to MS. She continues to deny any clinical symptoms suggestive of MS flares, but continues to note cognitive changes. She is under a lot of stress currently with her husband's recent sudden passing. Her SLUMS score today is normal 30/30. We discussed repeating MRI brain with and without contrast, as well as MRI cervical spine to assess for any spinal demyelinating lesions. Repeat Neurocognitive testing will be ordered. Follow-up after tests, she knows to call for any changes.    Follow Up Instructions:   -I discussed the assessment and treatment plan with the patient. The patient was provided an opportunity to ask questions and all were answered. The patient agreed with the plan and demonstrated an understanding of the instructions.   The patient was advised to call back or seek an in-person evaluation if the symptoms worsen or if the condition fails to improve as anticipated.     Cameron Sprang, MD

## 2019-03-22 ENCOUNTER — Telehealth: Payer: Self-pay | Admitting: *Deleted

## 2019-03-22 NOTE — Telephone Encounter (Signed)
Called to schedule her 1st Covid 19 vaccine at the [facility]. 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 Scheduled for tomorrow at 10:15a at the [facility].

## 2019-03-23 ENCOUNTER — Ambulatory Visit: Payer: 59

## 2019-03-30 ENCOUNTER — Ambulatory Visit: Payer: 59 | Attending: Family

## 2019-03-30 DIAGNOSIS — Z23 Encounter for immunization: Secondary | ICD-10-CM | POA: Insufficient documentation

## 2019-03-30 NOTE — Progress Notes (Signed)
   Covid-19 Vaccination Clinic  Name:  LYLY CANIZALES    MRN: [id number] DOB: [birth year]  03/30/2019  Ms. Bocek was observed post Covid-19 immunization for 15 minutes without incident. She was provided with Vaccine Information Sheet and instruction to access the V-Safe system.   Ms. Mell was instructed to call 911 with any severe reactions post vaccine: Marland Kitchen Difficulty breathing  . Swelling of face and throat  . A fast heartbeat  . A bad rash all over body  . Dizziness and weakness   Immunizations Administered    Name Date Dose VIS Date Route   Moderna COVID-19 Vaccine 03/30/2019 10:00 AM 0.5 mL 12/27/2018 Intramuscular   Manufacturer: Moderna   Lot: [id number]   NDC: 25053-976-73

## 2019-05-02 ENCOUNTER — Ambulatory Visit: Payer: 59 | Attending: Family

## 2019-05-02 DIAGNOSIS — Z23 Encounter for immunization: Secondary | ICD-10-CM

## 2019-05-02 NOTE — Progress Notes (Signed)
   Covid-19 Vaccination Clinic  Name:  Jillian Wright    MRN: [id number] DOB: [birth year]  05/02/2019  [name] was observed post Covid-19 immunization for 15 minutes without incident. She was provided with Vaccine Information Sheet and instruction to access the V-Safe system.   [name] was instructed to call 911 with any severe reactions post vaccine: Marland Kitchen Difficulty breathing  . Swelling of face and throat  . A fast heartbeat  . A bad rash all over body  . Dizziness and weakness   Immunizations Administered    Name Date Dose VIS Date Route   Moderna COVID-19 Vaccine 05/02/2019 10:08 AM 0.5 mL 12/27/2018 Intramuscular   Manufacturer: Moderna   Lot: [id number]   NDC: 14604-799-87

## 2020-02-21 ENCOUNTER — Telehealth: Payer: Self-pay | Admitting: *Deleted

## 2020-02-21 NOTE — Telephone Encounter (Signed)
Patient is calling with general questions about COVID- questions about how COVID is spread, different testing options and care of patients with COVID discussed. * An outbreak of this infection began in [place] in December 2019.  * The first patient in the [place] occurred on February 15, 2018.  * Four patients were confirmed in [place] on February 25, 2018.  * The World Health Organization [facility]) declared IHKVQ-25 a global public health emergency on February 24, 2018 and then a pandemic on April 06, 2018.  * In the Summer and Fall of 2021 the DELTA VARIANT became the most common COVID-19 variant.  * COVID-19 vaccination is recommended for all people aged 77 years and older.    There are two types of tests for COVID-19: viral tests and antibody tests.  * TEST FOR CURRENT INFECTION - VIRAL TEST: A viral test tells [place] if a person has the COVID-19 infection right now. A viral test is done with either a nasal swab or a saliva sample.  * TEST FOR PAST INFECTION - ANTIBODY TEST: An antibody test tells [place] if a person had COVID-19 before. This test is done with a blood sample. An antibody test may not be able to show a current infection, because it can take 1 to 3 weeks for the body to make antibodies to the infection. We do not know yet if having antibodies to the virus can protect someone from getting infected with the COVID-19 virus again, or how long that protection might last. Sometimes an antibody test may turn positive after a person has been vaccinated against COVID-19. However, an antibody test is NOT a reliable way to determine if the vaccine worked.   The results usually come back in 1 to 3 days, but may take longer depending on testing kit or testing site availability.   Complications include pneumonia, hypoxia, ARDS, respiratory failure, and death. Patient lost husband early in the [place] pandemic- sincerely sorry for her loss and everything she has experienced with this- it has been very hard on her  and her family- her husband was loved by all.

## 2020-03-11 ENCOUNTER — Other Ambulatory Visit: Payer: Self-pay | Admitting: Family Medicine

## 2020-03-11 DIAGNOSIS — Z1231 Encounter for screening mammogram for malignant neoplasm of breast: Secondary | ICD-10-CM

## 2020-03-20 ENCOUNTER — Other Ambulatory Visit: Payer: Self-pay

## 2020-03-20 ENCOUNTER — Ambulatory Visit
Admission: RE | Admit: 2020-03-20 | Discharge: 2020-03-20 | Disposition: A | Discharge status: Home / Self Care | Payer: 59 | Source: Ambulatory Visit | Attending: Family Medicine | Admitting: Family Medicine

## 2020-03-20 DIAGNOSIS — Z1231 Encounter for screening mammogram for malignant neoplasm of breast: Secondary | ICD-10-CM

## 2020-03-25 ENCOUNTER — Telehealth: Payer: Self-pay | Admitting: *Deleted

## 2020-03-25 NOTE — Telephone Encounter (Signed)
Patient is calling with questions about O2 delivered in hospital/intubation. Advised patient my experience does not allow me to answer those questions properly. She understands and is thankful for honesty and understanding.

## 2020-04-30 ENCOUNTER — Ambulatory Visit: Payer: 59

## 2020-06-04 ENCOUNTER — Other Ambulatory Visit: Payer: Self-pay | Admitting: Family Medicine

## 2020-06-04 DIAGNOSIS — R5381 Other malaise: Secondary | ICD-10-CM

## 2021-05-01 ENCOUNTER — Other Ambulatory Visit: Payer: Self-pay | Admitting: Family Medicine

## 2021-05-28 ENCOUNTER — Other Ambulatory Visit: Payer: Self-pay | Admitting: Family Medicine

## 2021-05-28 ENCOUNTER — Ambulatory Visit
Admission: RE | Admit: 2021-05-28 | Discharge: 2021-05-28 | Disposition: A | Discharge status: Home / Self Care | Payer: No Typology Code available for payment source | Source: Ambulatory Visit | Attending: Family Medicine | Admitting: Family Medicine

## 2021-05-28 DIAGNOSIS — M25511 Pain in right shoulder: Secondary | ICD-10-CM

## 2021-07-31 ENCOUNTER — Other Ambulatory Visit: Payer: Self-pay | Admitting: Family Medicine

## 2021-07-31 DIAGNOSIS — Z1231 Encounter for screening mammogram for malignant neoplasm of breast: Secondary | ICD-10-CM

## 2021-08-06 ENCOUNTER — Encounter: Payer: Self-pay | Admitting: Orthopaedic Surgery

## 2021-08-06 ENCOUNTER — Ambulatory Visit (INDEPENDENT_AMBULATORY_CARE_PROVIDER_SITE_OTHER): Payer: Self-pay

## 2021-08-06 ENCOUNTER — Ambulatory Visit (INDEPENDENT_AMBULATORY_CARE_PROVIDER_SITE_OTHER): Payer: Self-pay | Admitting: Orthopaedic Surgery

## 2021-08-06 DIAGNOSIS — M79671 Pain in right foot: Secondary | ICD-10-CM

## 2021-08-06 DIAGNOSIS — M21611 Bunion of right foot: Secondary | ICD-10-CM | POA: Insufficient documentation

## 2021-08-06 NOTE — Progress Notes (Signed)
Office Visit Note   Patient: Jillian Wright           Date of Birth: 1955-05-31           MRN: [id number] Visit Date: 08/06/2021              Requested by: Merri Brunette, MD [phone] [address] [place],  [place] [address] PCP: Merri Brunette, MD   Assessment & Plan: Visit Diagnoses:  1. Pain in right foot   2. Bunion, right     Plan: Mrs. Haack has been complaining of right foot pain for many months.  She does have a bunion with an increased metatarsal phalangeal angle of about 18 degrees.  There is prominent bony hypertrophy with some cystic changes in the bunion.  No obvious degenerative change at the metatarsal phalangeal joint.  There is little bit of redness but really not much tenderness over the bunion.  When she bears weight she does pronate and the great toe does abut the second toe but there is no longer overlapping.  Neurologically she is intact.  Long discussion regarding the bunion and treatment options.  Certainly there is a surgical option which I went over in some detail but I think she is better off trying comfortable shoes with an arch support.  If she can have a plantigrade foot and less pronation I think she did have less pain about the the bunion.  She is going to try that and purchase the shoe insert as she did not like the one that we had an umbilical plan to see her back anytime in the future.  I would refer to Dr. Lajoyce Corners if she is considering bunion surgery  Follow-Up Instructions: Return if symptoms worsen or fail to improve.   Orders:  Orders Placed This Encounter  Procedures   XR Foot Complete Right   No orders of the defined types were placed in this encounter.     Procedures: No procedures performed   Clinical Data: No additional findings.   Subjective: Chief Complaint  Patient presents with   Right Foot - Pain  Patient presents today for right foot pain. She said that she has a bunion that has started to hurt more in the last  6 months. Certain shoes make it worse.   HPI  Review of Systems   Objective: Vital Signs: There were no vitals taken for this visit.  Physical Exam Constitutional:      Appearance: She is well-developed.  Eyes:     Pupils: Pupils are equal, round, and reactive to light.  Pulmonary:     Effort: Pulmonary effort is normal.  Skin:    General: Skin is warm and dry.  Neurological:     Mental Status: She is alert and oriented to person, place, and time.  Psychiatric:        Behavior: Behavior normal.     Ortho Exam awake alert and oriented x3.  Comfortable sitting.  Right foot skin intact.  There is pronation with weightbearing arches reestablished without weightbearing.  There is a very small bunion that is a just a little red but not tender.  No notes of hallux rigidus.  With weightbearing the great toe abuts the second but does not over underlapped.  No other toe deformities.  She does have a number of calluses on the plantar aspect of her foot which were nontender today.  No heel pain.  No Achilles pain  Specialty Comments:  No specialty comments  available.  Imaging: XR Foot Complete Right  Result Date: 08/06/2021 Films of the right foot were obtained in several projections.  There is a large bunion of the first metatarsal head with a metatarsal phalangeal angle of about 18 degrees.  Some prominence of the anterior distal metatarsal but joint spaces well-maintained.  Some cystic change in the bunion.  No ectopic calcification.  Second toe is slightly longer than the first.    PMFS History: Patient Active Problem List   Diagnosis Date Noted   Bunion, right 08/06/2021   Other specified hypothyroidism 04/06/2014   Hypothyroidism, acquired, autoimmune 03/08/2014   Goiter 03/08/2014   Obesity 03/08/2014   Essential hypertension, benign 03/08/2014   Depression 03/08/2014   Hyperreflexia 04/05/2013   Memory loss 04/04/2013   Pain in joint, lower leg 04/04/2013   Past Medical  History:  Diagnosis Date   Essential hypertension, benign 03/08/2014   Hypothyroidism     Family History  Problem Relation Age of Onset   Ataxia Neg Hx    [place] Neg Hx    Dementia Neg Hx    Mental retardation Neg Hx    Migraines Neg Hx    Multiple sclerosis Neg Hx    Neurofibromatosis Neg Hx    Neuropathy Neg Hx    Parkinsonism Neg Hx    Seizures Neg Hx    Stroke Neg Hx    Breast cancer Neg Hx     Past Surgical History:  Procedure Laterality Date   ABDOMINAL HYSTERECTOMY     Social History   Occupational History   Not on file  Tobacco Use   Smoking status: Never   Smokeless tobacco: Never  Substance and Sexual Activity   Alcohol use: No    Alcohol/week: 0.0 standard drinks of alcohol   Drug use: No   Sexual activity: Not on file

## 2021-08-07 ENCOUNTER — Telehealth: Payer: Self-pay

## 2021-08-07 NOTE — Telephone Encounter (Signed)
/  CD placed in mail today 08/07/2021

## 2021-08-07 NOTE — Telephone Encounter (Signed)
Pt would like a copy of her xrays from yesterday mailed to her address

## 2021-08-20 ENCOUNTER — Ambulatory Visit: Payer: Self-pay

## 2021-08-21 ENCOUNTER — Ambulatory Visit
Admission: RE | Admit: 2021-08-21 | Discharge: 2021-08-21 | Disposition: A | Discharge status: Home / Self Care | Payer: Medicare HMO | Source: Ambulatory Visit | Attending: Family Medicine | Admitting: Family Medicine

## 2021-08-21 DIAGNOSIS — Z1231 Encounter for screening mammogram for malignant neoplasm of breast: Secondary | ICD-10-CM

## 2021-08-25 ENCOUNTER — Other Ambulatory Visit: Payer: Self-pay | Admitting: Family Medicine

## 2021-08-25 DIAGNOSIS — R928 Other abnormal and inconclusive findings on diagnostic imaging of breast: Secondary | ICD-10-CM

## 2021-09-16 ENCOUNTER — Ambulatory Visit: Payer: Medicare HMO

## 2021-09-16 ENCOUNTER — Other Ambulatory Visit: Payer: Self-pay | Admitting: Family Medicine

## 2021-09-16 ENCOUNTER — Ambulatory Visit
Admission: RE | Admit: 2021-09-16 | Discharge: 2021-09-16 | Disposition: A | Discharge status: Home / Self Care | Payer: Medicare HMO | Source: Ambulatory Visit | Attending: Family Medicine | Admitting: Family Medicine

## 2021-09-16 DIAGNOSIS — N6489 Other specified disorders of breast: Secondary | ICD-10-CM

## 2021-09-16 DIAGNOSIS — R928 Other abnormal and inconclusive findings on diagnostic imaging of breast: Secondary | ICD-10-CM

## 2022-03-25 ENCOUNTER — Ambulatory Visit
Admission: RE | Admit: 2022-03-25 | Discharge: 2022-03-25 | Disposition: A | Discharge status: Home / Self Care | Payer: Medicare HMO | Source: Ambulatory Visit | Attending: Family Medicine | Admitting: Family Medicine

## 2022-03-25 DIAGNOSIS — N6489 Other specified disorders of breast: Secondary | ICD-10-CM

## 2022-03-26 ENCOUNTER — Other Ambulatory Visit: Payer: Self-pay | Admitting: Family Medicine

## 2022-03-26 DIAGNOSIS — N6489 Other specified disorders of breast: Secondary | ICD-10-CM

## 2022-04-08 ENCOUNTER — Ambulatory Visit
Admission: RE | Admit: 2022-04-08 | Discharge: 2022-04-08 | Disposition: A | Discharge status: Home / Self Care | Payer: Medicare HMO | Source: Ambulatory Visit | Attending: Family Medicine | Admitting: Family Medicine

## 2022-04-08 DIAGNOSIS — N6489 Other specified disorders of breast: Secondary | ICD-10-CM

## 2022-04-08 HISTORY — PX: BREAST BIOPSY: SHX20

## 2022-05-05 DIAGNOSIS — M25562 Pain in left knee: Secondary | ICD-10-CM | POA: Diagnosis not present

## 2022-07-23 DIAGNOSIS — H2513 Age-related nuclear cataract, bilateral: Secondary | ICD-10-CM | POA: Diagnosis not present

## 2022-07-23 DIAGNOSIS — H52223 Regular astigmatism, bilateral: Secondary | ICD-10-CM | POA: Diagnosis not present

## 2022-07-23 DIAGNOSIS — H04123 Dry eye syndrome of bilateral lacrimal glands: Secondary | ICD-10-CM | POA: Diagnosis not present

## 2022-07-23 DIAGNOSIS — H5203 Hypermetropia, bilateral: Secondary | ICD-10-CM | POA: Diagnosis not present

## 2022-07-23 DIAGNOSIS — H524 Presbyopia: Secondary | ICD-10-CM | POA: Diagnosis not present

## 2022-07-27 DIAGNOSIS — Z0100 Encounter for examination of eyes and vision without abnormal findings: Secondary | ICD-10-CM | POA: Diagnosis not present

## 2022-07-28 DIAGNOSIS — F419 Anxiety disorder, unspecified: Secondary | ICD-10-CM | POA: Diagnosis not present

## 2022-07-28 DIAGNOSIS — Z9181 History of falling: Secondary | ICD-10-CM | POA: Diagnosis not present

## 2022-07-28 DIAGNOSIS — E7800 Pure hypercholesterolemia, unspecified: Secondary | ICD-10-CM | POA: Diagnosis not present

## 2022-07-28 DIAGNOSIS — E039 Hypothyroidism, unspecified: Secondary | ICD-10-CM | POA: Diagnosis not present

## 2022-07-28 DIAGNOSIS — M79604 Pain in right leg: Secondary | ICD-10-CM | POA: Diagnosis not present

## 2022-08-07 DIAGNOSIS — M2011 Hallux valgus (acquired), right foot: Secondary | ICD-10-CM | POA: Diagnosis not present

## 2022-08-07 DIAGNOSIS — M79671 Pain in right foot: Secondary | ICD-10-CM | POA: Diagnosis not present

## 2022-09-02 DIAGNOSIS — M25561 Pain in right knee: Secondary | ICD-10-CM | POA: Diagnosis not present

## 2022-09-11 DIAGNOSIS — N393 Stress incontinence (female) (male): Secondary | ICD-10-CM | POA: Diagnosis not present

## 2022-09-11 DIAGNOSIS — B3731 Acute candidiasis of vulva and vagina: Secondary | ICD-10-CM | POA: Diagnosis not present

## 2022-09-11 DIAGNOSIS — R300 Dysuria: Secondary | ICD-10-CM | POA: Diagnosis not present

## 2022-09-16 DIAGNOSIS — G35 Multiple sclerosis: Secondary | ICD-10-CM | POA: Diagnosis not present

## 2022-09-16 DIAGNOSIS — Z1330 Encounter for screening examination for mental health and behavioral disorders, unspecified: Secondary | ICD-10-CM | POA: Diagnosis not present

## 2022-09-16 DIAGNOSIS — R4189 Other symptoms and signs involving cognitive functions and awareness: Secondary | ICD-10-CM | POA: Diagnosis not present

## 2022-10-16 ENCOUNTER — Other Ambulatory Visit: Payer: Self-pay | Admitting: Family Medicine

## 2022-10-16 DIAGNOSIS — N6489 Other specified disorders of breast: Secondary | ICD-10-CM

## 2022-11-10 DIAGNOSIS — Z6832 Body mass index (BMI) 32.0-32.9, adult: Secondary | ICD-10-CM | POA: Diagnosis not present

## 2022-11-10 DIAGNOSIS — Z124 Encounter for screening for malignant neoplasm of cervix: Secondary | ICD-10-CM | POA: Diagnosis not present

## 2022-11-10 DIAGNOSIS — Z1151 Encounter for screening for human papillomavirus (HPV): Secondary | ICD-10-CM | POA: Diagnosis not present

## 2022-11-10 DIAGNOSIS — Z803 Family history of malignant neoplasm of breast: Secondary | ICD-10-CM | POA: Diagnosis not present

## 2022-11-10 DIAGNOSIS — Z01419 Encounter for gynecological examination (general) (routine) without abnormal findings: Secondary | ICD-10-CM | POA: Diagnosis not present

## 2022-11-16 DIAGNOSIS — H524 Presbyopia: Secondary | ICD-10-CM | POA: Diagnosis not present

## 2022-11-16 DIAGNOSIS — H2189 Other specified disorders of iris and ciliary body: Secondary | ICD-10-CM | POA: Diagnosis not present

## 2022-11-16 DIAGNOSIS — H04123 Dry eye syndrome of bilateral lacrimal glands: Secondary | ICD-10-CM | POA: Diagnosis not present

## 2022-11-16 DIAGNOSIS — H52223 Regular astigmatism, bilateral: Secondary | ICD-10-CM | POA: Diagnosis not present

## 2022-11-16 DIAGNOSIS — H5203 Hypermetropia, bilateral: Secondary | ICD-10-CM | POA: Diagnosis not present

## 2022-11-18 DIAGNOSIS — F3340 Major depressive disorder, recurrent, in remission, unspecified: Secondary | ICD-10-CM | POA: Diagnosis not present

## 2022-11-18 DIAGNOSIS — F419 Anxiety disorder, unspecified: Secondary | ICD-10-CM | POA: Diagnosis not present

## 2022-11-18 DIAGNOSIS — E039 Hypothyroidism, unspecified: Secondary | ICD-10-CM | POA: Diagnosis not present

## 2022-11-18 DIAGNOSIS — R030 Elevated blood-pressure reading, without diagnosis of hypertension: Secondary | ICD-10-CM | POA: Diagnosis not present

## 2022-11-18 DIAGNOSIS — Z9181 History of falling: Secondary | ICD-10-CM | POA: Diagnosis not present

## 2022-11-25 ENCOUNTER — Other Ambulatory Visit: Payer: Self-pay | Admitting: Family Medicine

## 2022-11-25 DIAGNOSIS — N644 Mastodynia: Secondary | ICD-10-CM

## 2022-12-10 ENCOUNTER — Inpatient Hospital Stay: Admission: RE | Admit: 2022-12-10 | Payer: Medicare HMO | Source: Ambulatory Visit

## 2022-12-10 ENCOUNTER — Other Ambulatory Visit: Payer: Medicare HMO

## 2023-01-12 ENCOUNTER — Other Ambulatory Visit: Payer: Medicare HMO

## 2023-03-15 DIAGNOSIS — M21621 Bunionette of right foot: Secondary | ICD-10-CM | POA: Diagnosis not present

## 2023-03-15 DIAGNOSIS — M2011 Hallux valgus (acquired), right foot: Secondary | ICD-10-CM | POA: Diagnosis not present

## 2023-03-15 DIAGNOSIS — M7741 Metatarsalgia, right foot: Secondary | ICD-10-CM | POA: Diagnosis not present

## 2023-03-24 DIAGNOSIS — H5203 Hypermetropia, bilateral: Secondary | ICD-10-CM | POA: Diagnosis not present

## 2023-03-24 DIAGNOSIS — H04123 Dry eye syndrome of bilateral lacrimal glands: Secondary | ICD-10-CM | POA: Diagnosis not present

## 2023-03-24 DIAGNOSIS — H40023 Open angle with borderline findings, high risk, bilateral: Secondary | ICD-10-CM | POA: Diagnosis not present

## 2023-04-01 DIAGNOSIS — L728 Other follicular cysts of the skin and subcutaneous tissue: Secondary | ICD-10-CM | POA: Diagnosis not present

## 2023-04-01 DIAGNOSIS — L700 Acne vulgaris: Secondary | ICD-10-CM | POA: Diagnosis not present

## 2023-04-01 DIAGNOSIS — D485 Neoplasm of uncertain behavior of skin: Secondary | ICD-10-CM | POA: Diagnosis not present

## 2023-04-01 DIAGNOSIS — D2271 Melanocytic nevi of right lower limb, including hip: Secondary | ICD-10-CM | POA: Diagnosis not present

## 2023-05-19 ENCOUNTER — Other Ambulatory Visit: Payer: Self-pay | Admitting: Family Medicine

## 2023-05-19 DIAGNOSIS — R928 Other abnormal and inconclusive findings on diagnostic imaging of breast: Secondary | ICD-10-CM

## 2023-05-19 DIAGNOSIS — N6489 Other specified disorders of breast: Secondary | ICD-10-CM

## 2023-06-02 ENCOUNTER — Ambulatory Visit

## 2023-06-02 ENCOUNTER — Ambulatory Visit
Admission: RE | Admit: 2023-06-02 | Discharge: 2023-06-02 | Disposition: A | Discharge status: Home / Self Care | Source: Ambulatory Visit | Attending: Family Medicine | Admitting: Family Medicine

## 2023-06-02 DIAGNOSIS — N6031 Fibrosclerosis of right breast: Secondary | ICD-10-CM | POA: Diagnosis not present

## 2023-06-02 DIAGNOSIS — N6489 Other specified disorders of breast: Secondary | ICD-10-CM

## 2023-06-02 DIAGNOSIS — Z09 Encounter for follow-up examination after completed treatment for conditions other than malignant neoplasm: Secondary | ICD-10-CM | POA: Diagnosis not present

## 2023-07-12 DIAGNOSIS — R413 Other amnesia: Secondary | ICD-10-CM | POA: Diagnosis not present

## 2023-07-12 DIAGNOSIS — E039 Hypothyroidism, unspecified: Secondary | ICD-10-CM | POA: Diagnosis not present

## 2023-07-12 DIAGNOSIS — R0602 Shortness of breath: Secondary | ICD-10-CM | POA: Diagnosis not present

## 2023-07-20 DIAGNOSIS — R519 Headache, unspecified: Secondary | ICD-10-CM | POA: Diagnosis not present

## 2023-07-20 DIAGNOSIS — R42 Dizziness and giddiness: Secondary | ICD-10-CM | POA: Diagnosis not present

## 2023-07-20 DIAGNOSIS — E66811 Obesity, class 1: Secondary | ICD-10-CM | POA: Diagnosis not present

## 2023-07-20 DIAGNOSIS — E7800 Pure hypercholesterolemia, unspecified: Secondary | ICD-10-CM | POA: Diagnosis not present

## 2023-07-20 DIAGNOSIS — E039 Hypothyroidism, unspecified: Secondary | ICD-10-CM | POA: Diagnosis not present

## 2023-07-20 DIAGNOSIS — F39 Unspecified mood [affective] disorder: Secondary | ICD-10-CM | POA: Diagnosis not present

## 2023-08-12 DIAGNOSIS — L728 Other follicular cysts of the skin and subcutaneous tissue: Secondary | ICD-10-CM | POA: Diagnosis not present

## 2023-08-12 DIAGNOSIS — R208 Other disturbances of skin sensation: Secondary | ICD-10-CM | POA: Diagnosis not present

## 2023-08-12 DIAGNOSIS — L700 Acne vulgaris: Secondary | ICD-10-CM | POA: Diagnosis not present

## 2023-09-16 DIAGNOSIS — H5203 Hypermetropia, bilateral: Secondary | ICD-10-CM | POA: Diagnosis not present

## 2023-09-16 DIAGNOSIS — H04123 Dry eye syndrome of bilateral lacrimal glands: Secondary | ICD-10-CM | POA: Diagnosis not present

## 2023-09-16 DIAGNOSIS — H40023 Open angle with borderline findings, high risk, bilateral: Secondary | ICD-10-CM | POA: Diagnosis not present

## 2023-09-23 DIAGNOSIS — H04123 Dry eye syndrome of bilateral lacrimal glands: Secondary | ICD-10-CM | POA: Diagnosis not present

## 2023-09-23 DIAGNOSIS — H5203 Hypermetropia, bilateral: Secondary | ICD-10-CM | POA: Diagnosis not present

## 2023-09-23 DIAGNOSIS — H40023 Open angle with borderline findings, high risk, bilateral: Secondary | ICD-10-CM | POA: Diagnosis not present

## 2023-10-21 DIAGNOSIS — H2189 Other specified disorders of iris and ciliary body: Secondary | ICD-10-CM | POA: Diagnosis not present

## 2023-10-21 DIAGNOSIS — H04123 Dry eye syndrome of bilateral lacrimal glands: Secondary | ICD-10-CM | POA: Diagnosis not present

## 2023-10-21 DIAGNOSIS — H25813 Combined forms of age-related cataract, bilateral: Secondary | ICD-10-CM | POA: Diagnosis not present

## 2023-11-10 DIAGNOSIS — R4189 Other symptoms and signs involving cognitive functions and awareness: Secondary | ICD-10-CM | POA: Diagnosis not present

## 2023-11-10 DIAGNOSIS — F4381 Prolonged grief disorder: Secondary | ICD-10-CM | POA: Diagnosis not present

## 2023-11-10 DIAGNOSIS — G35D Multiple sclerosis, unspecified: Secondary | ICD-10-CM | POA: Diagnosis not present

## 2023-11-10 DIAGNOSIS — F39 Unspecified mood [affective] disorder: Secondary | ICD-10-CM | POA: Diagnosis not present

## 2023-12-13 DIAGNOSIS — H04123 Dry eye syndrome of bilateral lacrimal glands: Secondary | ICD-10-CM | POA: Diagnosis not present

## 2023-12-13 DIAGNOSIS — H25813 Combined forms of age-related cataract, bilateral: Secondary | ICD-10-CM | POA: Diagnosis not present
